# Patient Record
Sex: Female | Born: 1935
Health system: Southern US, Community
[De-identification: ages and names within clinical notes are randomized; demographics above are authoritative.]

## PROBLEM LIST (undated history)

## (undated) DIAGNOSIS — E039 Hypothyroidism, unspecified: Secondary | ICD-10-CM

## (undated) DIAGNOSIS — I10 Essential (primary) hypertension: Secondary | ICD-10-CM

## (undated) DIAGNOSIS — H04123 Dry eye syndrome of bilateral lacrimal glands: Secondary | ICD-10-CM

## (undated) HISTORY — PX: CHOLECYSTECTOMY: SHX55

## (undated) HISTORY — DX: Hypothyroidism, unspecified: E03.9

## (undated) HISTORY — PX: CATARACT EXTRACTION: SUR2

## (undated) HISTORY — DX: Dry eye syndrome of bilateral lacrimal glands: H04.123

## (undated) HISTORY — PX: KNEE ARTHROSCOPY W/ ORIF: SUR98

## (undated) HISTORY — DX: Essential (primary) hypertension: I10

---

## 2000-04-11 ENCOUNTER — Emergency Department (HOSPITAL_COMMUNITY): Admission: EM | Admit: 2000-04-11 | Discharge: 2000-04-12 | Payer: Self-pay | Admitting: Emergency Medicine

## 2000-04-12 ENCOUNTER — Encounter: Payer: Self-pay | Admitting: Emergency Medicine

## 2000-05-07 ENCOUNTER — Other Ambulatory Visit: Admission: RE | Admit: 2000-05-07 | Discharge: 2000-05-07 | Payer: Self-pay | Admitting: *Deleted

## 2000-05-08 ENCOUNTER — Encounter (INDEPENDENT_AMBULATORY_CARE_PROVIDER_SITE_OTHER): Payer: Self-pay | Admitting: Specialist

## 2000-05-08 ENCOUNTER — Other Ambulatory Visit: Admission: RE | Admit: 2000-05-08 | Discharge: 2000-05-08 | Payer: Self-pay | Admitting: *Deleted

## 2000-06-10 ENCOUNTER — Encounter: Payer: Self-pay | Admitting: Obstetrics and Gynecology

## 2000-06-10 ENCOUNTER — Ambulatory Visit (HOSPITAL_COMMUNITY): Admission: RE | Admit: 2000-06-10 | Discharge: 2000-06-10 | Payer: Self-pay | Admitting: Obstetrics and Gynecology

## 2000-12-23 ENCOUNTER — Encounter (INDEPENDENT_AMBULATORY_CARE_PROVIDER_SITE_OTHER): Payer: Self-pay | Admitting: Specialist

## 2000-12-23 ENCOUNTER — Other Ambulatory Visit: Admission: RE | Admit: 2000-12-23 | Discharge: 2000-12-23 | Payer: Self-pay | Admitting: Obstetrics and Gynecology

## 2001-05-12 ENCOUNTER — Other Ambulatory Visit: Admission: RE | Admit: 2001-05-12 | Discharge: 2001-05-12 | Payer: Self-pay | Admitting: Obstetrics and Gynecology

## 2001-06-14 ENCOUNTER — Ambulatory Visit (HOSPITAL_COMMUNITY): Admission: RE | Admit: 2001-06-14 | Discharge: 2001-06-14 | Payer: Self-pay | Admitting: Obstetrics and Gynecology

## 2001-06-14 ENCOUNTER — Encounter: Payer: Self-pay | Admitting: Obstetrics and Gynecology

## 2001-08-12 ENCOUNTER — Encounter: Payer: Self-pay | Admitting: Internal Medicine

## 2001-08-12 ENCOUNTER — Encounter: Admission: RE | Admit: 2001-08-12 | Discharge: 2001-08-12 | Payer: Self-pay | Admitting: Internal Medicine

## 2003-09-13 ENCOUNTER — Emergency Department (HOSPITAL_COMMUNITY): Admission: EM | Admit: 2003-09-13 | Discharge: 2003-09-13 | Payer: Self-pay | Admitting: Emergency Medicine

## 2004-04-13 ENCOUNTER — Inpatient Hospital Stay (HOSPITAL_COMMUNITY): Admission: EM | Admit: 2004-04-13 | Discharge: 2004-04-19 | Payer: Self-pay | Admitting: Emergency Medicine

## 2004-04-19 ENCOUNTER — Ambulatory Visit: Payer: Self-pay | Admitting: Physical Medicine & Rehabilitation

## 2004-04-19 ENCOUNTER — Inpatient Hospital Stay
Admission: RE | Admit: 2004-04-19 | Discharge: 2004-04-30 | Payer: Self-pay | Admitting: Physical Medicine & Rehabilitation

## 2004-07-22 ENCOUNTER — Ambulatory Visit (HOSPITAL_COMMUNITY): Admission: RE | Admit: 2004-07-22 | Discharge: 2004-07-22 | Payer: Self-pay | Admitting: Internal Medicine

## 2004-12-04 ENCOUNTER — Ambulatory Visit: Payer: Self-pay | Admitting: Gastroenterology

## 2004-12-18 ENCOUNTER — Ambulatory Visit: Payer: Self-pay | Admitting: Gastroenterology

## 2004-12-18 ENCOUNTER — Encounter (INDEPENDENT_AMBULATORY_CARE_PROVIDER_SITE_OTHER): Payer: Self-pay | Admitting: *Deleted

## 2005-05-13 ENCOUNTER — Encounter: Admission: RE | Admit: 2005-05-13 | Discharge: 2005-05-13 | Payer: Self-pay | Admitting: Orthopedic Surgery

## 2005-05-14 ENCOUNTER — Ambulatory Visit (HOSPITAL_BASED_OUTPATIENT_CLINIC_OR_DEPARTMENT_OTHER): Admission: RE | Admit: 2005-05-14 | Discharge: 2005-05-14 | Payer: Self-pay | Admitting: Orthopedic Surgery

## 2005-05-14 ENCOUNTER — Ambulatory Visit (HOSPITAL_COMMUNITY): Admission: RE | Admit: 2005-05-14 | Discharge: 2005-05-14 | Payer: Self-pay | Admitting: Orthopedic Surgery

## 2005-07-29 ENCOUNTER — Ambulatory Visit (HOSPITAL_COMMUNITY): Admission: RE | Admit: 2005-07-29 | Discharge: 2005-07-29 | Payer: Self-pay | Admitting: Internal Medicine

## 2005-08-15 ENCOUNTER — Encounter: Admission: RE | Admit: 2005-08-15 | Discharge: 2005-08-15 | Payer: Self-pay | Admitting: Internal Medicine

## 2006-08-17 ENCOUNTER — Encounter: Admission: RE | Admit: 2006-08-17 | Discharge: 2006-08-17 | Payer: Self-pay | Admitting: Internal Medicine

## 2007-08-18 ENCOUNTER — Encounter: Admission: RE | Admit: 2007-08-18 | Discharge: 2007-08-18 | Payer: Self-pay | Admitting: Internal Medicine

## 2007-12-14 ENCOUNTER — Ambulatory Visit: Payer: Self-pay | Admitting: Gastroenterology

## 2007-12-28 ENCOUNTER — Ambulatory Visit: Payer: Self-pay | Admitting: Gastroenterology

## 2008-08-22 ENCOUNTER — Encounter: Admission: RE | Admit: 2008-08-22 | Discharge: 2008-08-22 | Payer: Self-pay | Admitting: Internal Medicine

## 2009-04-26 DIAGNOSIS — M899 Disorder of bone, unspecified: Secondary | ICD-10-CM | POA: Insufficient documentation

## 2009-04-26 DIAGNOSIS — E559 Vitamin D deficiency, unspecified: Secondary | ICD-10-CM | POA: Insufficient documentation

## 2009-04-26 DIAGNOSIS — E785 Hyperlipidemia, unspecified: Secondary | ICD-10-CM | POA: Insufficient documentation

## 2009-04-26 DIAGNOSIS — K5900 Constipation, unspecified: Secondary | ICD-10-CM | POA: Insufficient documentation

## 2009-06-28 DIAGNOSIS — R0600 Dyspnea, unspecified: Secondary | ICD-10-CM | POA: Insufficient documentation

## 2009-08-23 ENCOUNTER — Encounter: Admission: RE | Admit: 2009-08-23 | Discharge: 2009-08-23 | Payer: Self-pay | Admitting: Internal Medicine

## 2009-12-26 ENCOUNTER — Encounter: Payer: Self-pay | Admitting: Gastroenterology

## 2009-12-26 DIAGNOSIS — R634 Abnormal weight loss: Secondary | ICD-10-CM | POA: Insufficient documentation

## 2009-12-26 DIAGNOSIS — H209 Unspecified iridocyclitis: Secondary | ICD-10-CM | POA: Insufficient documentation

## 2009-12-26 DIAGNOSIS — H409 Unspecified glaucoma: Secondary | ICD-10-CM | POA: Insufficient documentation

## 2009-12-28 ENCOUNTER — Ambulatory Visit (HOSPITAL_COMMUNITY): Admission: RE | Admit: 2009-12-28 | Discharge: 2009-12-28 | Payer: Self-pay | Admitting: Internal Medicine

## 2009-12-28 DIAGNOSIS — R7401 Elevation of levels of liver transaminase levels: Secondary | ICD-10-CM | POA: Insufficient documentation

## 2009-12-28 DIAGNOSIS — K8064 Calculus of gallbladder and bile duct with chronic cholecystitis without obstruction: Secondary | ICD-10-CM | POA: Insufficient documentation

## 2010-01-04 ENCOUNTER — Encounter: Admission: RE | Admit: 2010-01-04 | Discharge: 2010-01-04 | Payer: Self-pay | Admitting: Internal Medicine

## 2010-01-09 ENCOUNTER — Encounter: Payer: Self-pay | Admitting: Gastroenterology

## 2010-01-09 DIAGNOSIS — R945 Abnormal results of liver function studies: Secondary | ICD-10-CM | POA: Insufficient documentation

## 2010-02-04 ENCOUNTER — Encounter: Payer: Self-pay | Admitting: Gastroenterology

## 2010-02-05 ENCOUNTER — Encounter: Payer: Self-pay | Admitting: Gastroenterology

## 2010-02-06 ENCOUNTER — Encounter (INDEPENDENT_AMBULATORY_CARE_PROVIDER_SITE_OTHER): Payer: Self-pay | Admitting: General Surgery

## 2010-02-06 ENCOUNTER — Ambulatory Visit (HOSPITAL_COMMUNITY): Admission: RE | Admit: 2010-02-06 | Discharge: 2010-02-07 | Payer: Self-pay | Admitting: General Surgery

## 2010-03-27 DIAGNOSIS — N939 Abnormal uterine and vaginal bleeding, unspecified: Secondary | ICD-10-CM | POA: Insufficient documentation

## 2010-07-11 NOTE — Letter (Signed)
Summary: [facility]  [facility]   Imported By: Lester [place] 02/14/2010 11:12:56  _____________________________________________________________________  External Attachment:    Type:   Image     Comment:   External Document

## 2010-07-18 ENCOUNTER — Other Ambulatory Visit: Payer: Self-pay | Admitting: Internal Medicine

## 2010-07-18 DIAGNOSIS — Z1231 Encounter for screening mammogram for malignant neoplasm of breast: Secondary | ICD-10-CM

## 2010-08-22 LAB — COMPREHENSIVE METABOLIC PANEL
ALT: 90 U/L — ABNORMAL HIGH (ref 0–35)
AST: 152 U/L — ABNORMAL HIGH (ref 0–37)
Albumin: 3.2 g/dL — ABNORMAL LOW (ref 3.5–5.2)
CO2: 25 mEq/L (ref 19–32)
Calcium: 8.7 mg/dL (ref 8.4–10.5)
Creatinine, Ser: 0.94 mg/dL (ref 0.4–1.2)
GFR calc Af Amer: >60 mL/min (ref >60)
GFR calc non Af Amer: 57 mL/min — ABNORMAL LOW (ref >60)
Sodium: 142 mEq/L (ref 135–145)
Total Protein: 5.7 g/dL — ABNORMAL LOW (ref 6.0–8.3)

## 2010-08-22 LAB — DIFFERENTIAL
Basophils Absolute: 0 10*3/uL (ref 0.0–0.1)
Basophils Relative: 1 % (ref 0–1)
Eosinophils Relative: 2 % (ref 0–5)
Monocytes Absolute: 0.6 10*3/uL (ref 0.1–1.0)

## 2010-08-22 LAB — CBC
Hemoglobin: 12.8 g/dL (ref 12.0–15.0)
MCHC: 33.8 g/dL (ref 30.0–36.0)
Platelets: 152 10*3/uL (ref 150–400)

## 2010-08-22 LAB — SURGICAL PCR SCREEN
MRSA, PCR: NEGATIVE
Staphylococcus aureus: NEGATIVE

## 2010-08-26 ENCOUNTER — Ambulatory Visit (HOSPITAL_COMMUNITY)
Admission: RE | Admit: 2010-08-26 | Discharge: 2010-08-26 | Disposition: A | Discharge status: Home / Self Care | Payer: 59 | Source: Ambulatory Visit | Attending: Internal Medicine | Admitting: Internal Medicine

## 2010-08-26 DIAGNOSIS — Z1231 Encounter for screening mammogram for malignant neoplasm of breast: Secondary | ICD-10-CM | POA: Insufficient documentation

## 2010-09-03 DIAGNOSIS — R600 Localized edema: Secondary | ICD-10-CM | POA: Insufficient documentation

## 2010-10-07 IMAGING — RF DG CHOLANGIOGRAM OPERATIVE
1 series · 4 of 4 positions shown · non-contrast
Comparison: None

CLINICAL DATA: Cholelithiasis

INTRAOPERATIVE CHOLANGIOGRAM
TECHNIQUE: Cholangiographic images from the C-arm fluoroscopic
device were submitted for interpretation post-operatively.  Please
see the procedural report for the amount of contrast and the
fluoroscopy time utilized.

[Series 1: run · 4 of 65 frames shown]
[frame 2/65]
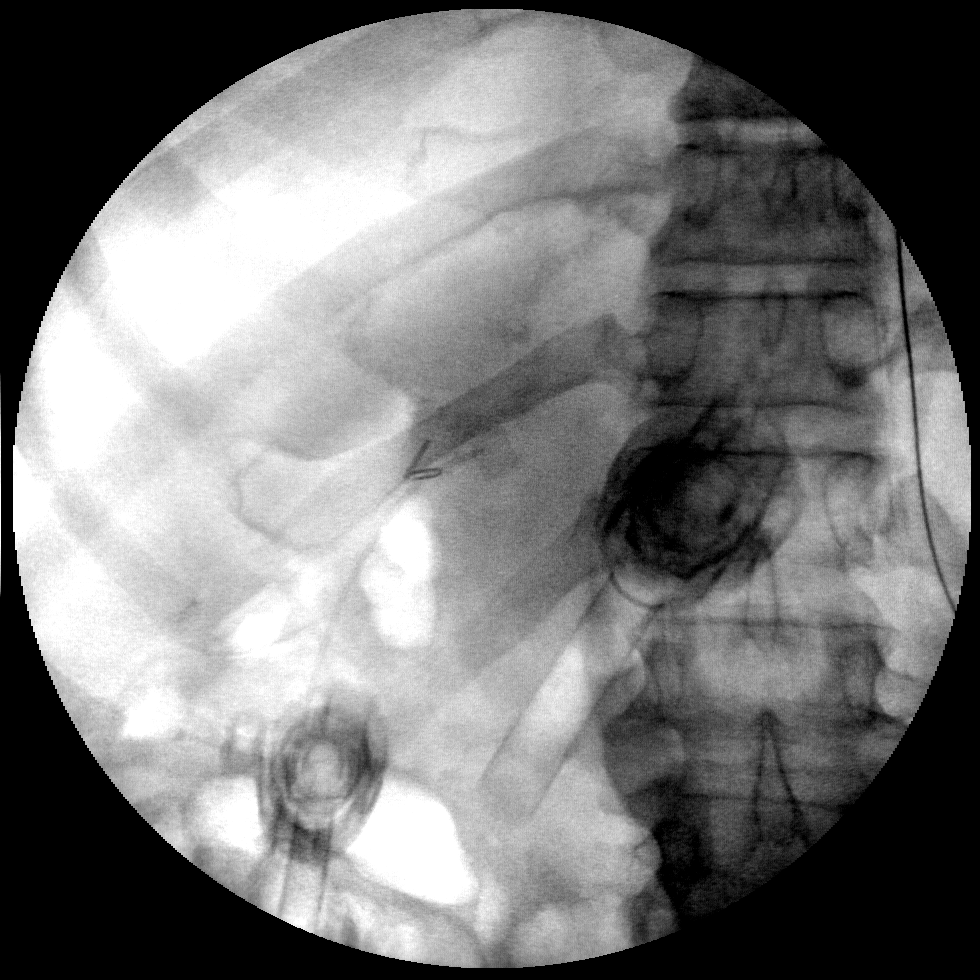
[frame 10/65]
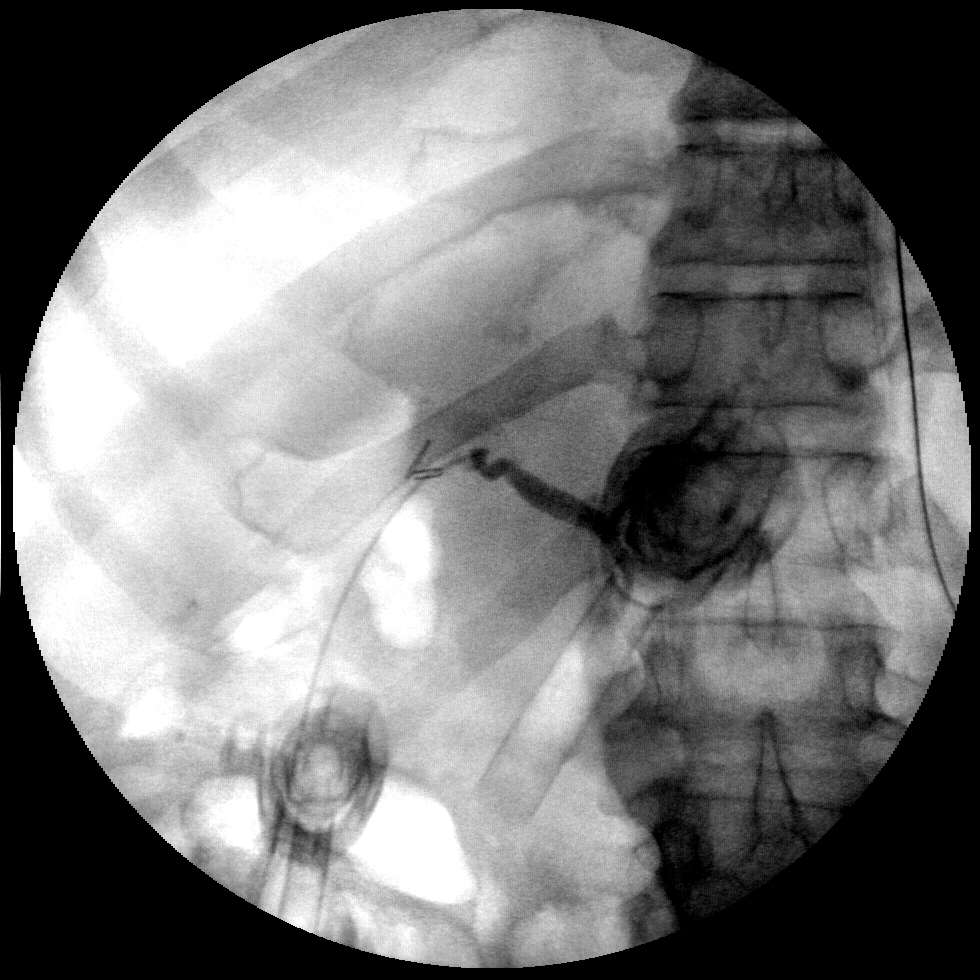
[frame 33/65]
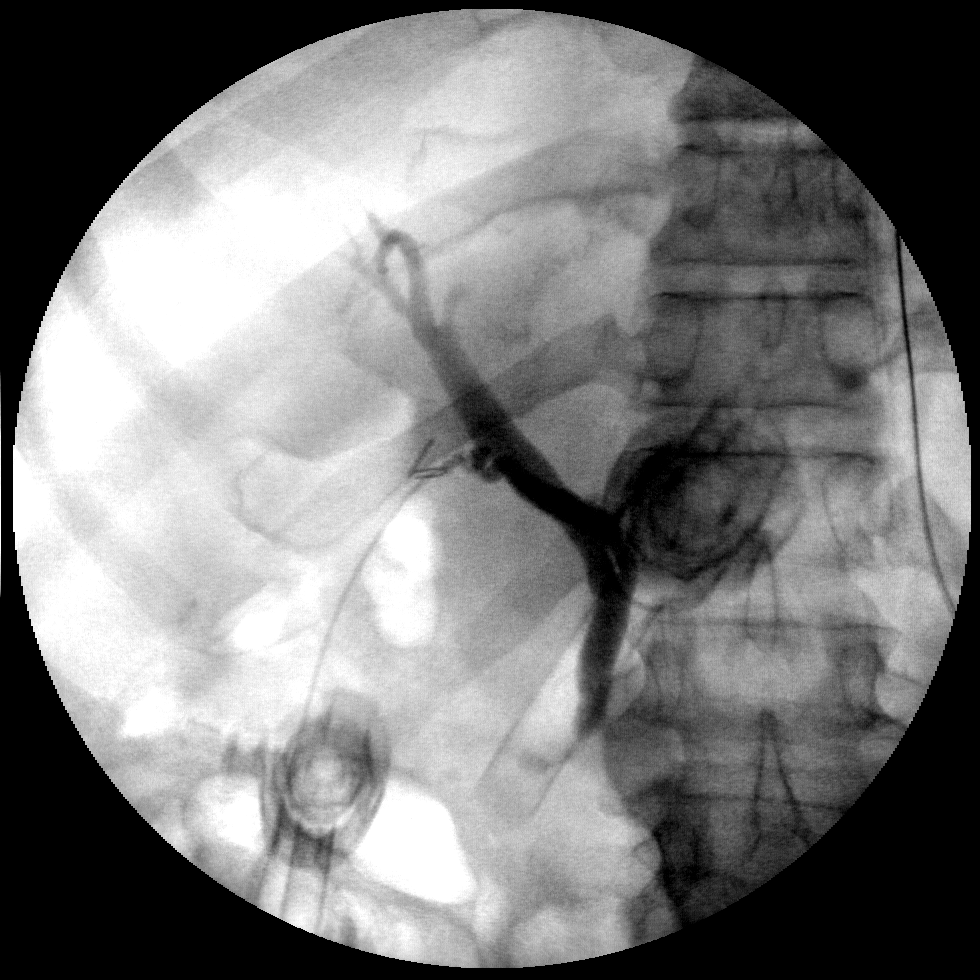
[frame 56/65]
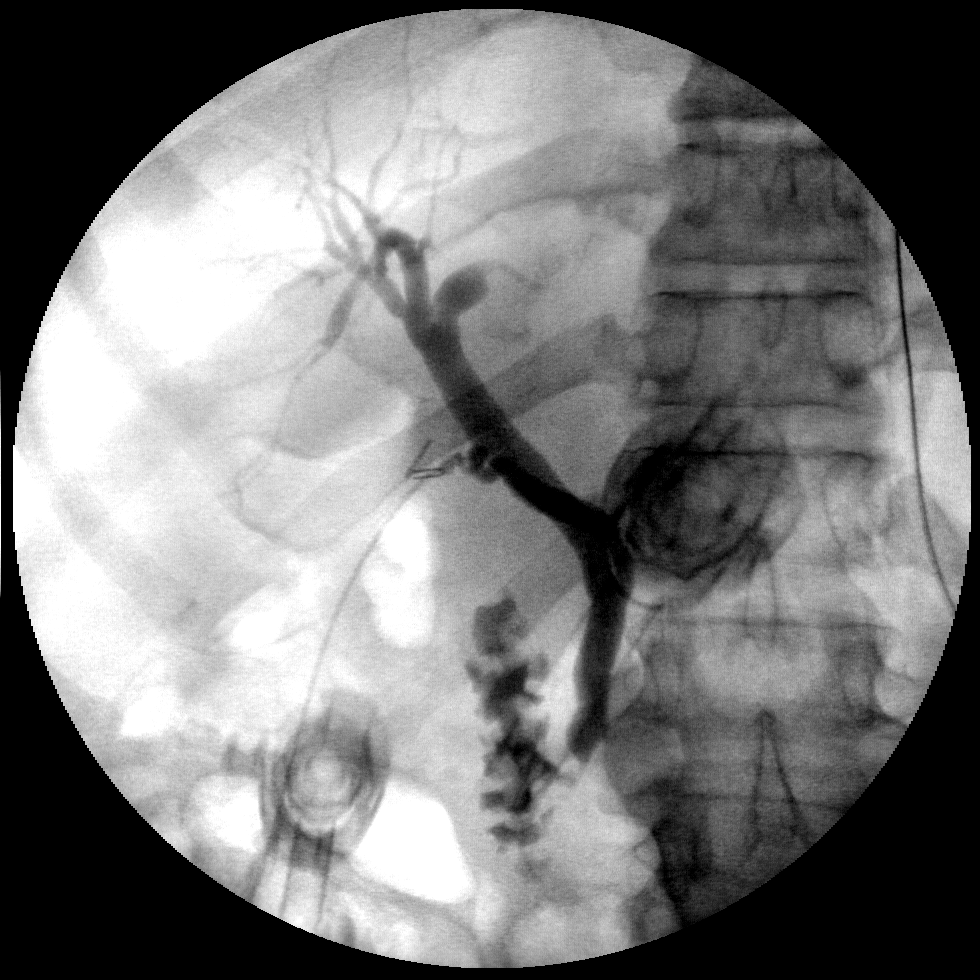

[4 of 4 positions shown; findings below may reference images not displayed]

FINDINGS: No persistent filling defects in the common duct.
Intrahepatic ducts are incompletely visualized, appearing
decompressed centrally. Contrast passes into the duodenum.

IMPRESSION

Negative for retained common duct stone.

## 2010-10-25 NOTE — Op Note (Signed)
NAMEMERIAN, WROE                ACCOUNT NO.:  [id number]   MEDICAL RECORD NO.:  [id number]          PATIENT TYPE:  AMB   LOCATION:  DSC                          FACILITY:  [facility]   PHYSICIAN:  Harvie Junior, M.D.   DATE OF BIRTH:  December 22, 1925   DATE OF PROCEDURE:  05/14/2005  DATE OF DISCHARGE:                                 OPERATIVE REPORT   PREOPERATIVE DIAGNOSIS:  Status post lateral tibial plateau fracture with  lateral knee pain.   POSTOPERATIVE DIAGNOSES:  1.  Status post lateral tibial plateau fracture with lateral knee pain.  2.  Loose body laterally.  3.  Chondromalacia of medial femoral condyle with meniscal tear medially.   PRINCIPAL PROCEDURES:  1.  Debridement of chondromalacia of the lateral tibial plateau by way of      chondroplasty.  2.  Removal of osteocartilaginous loose body laterally.  3.  Debridement of posterior horn medial meniscus with corresponding      debridement of medial femoral condyle.   SURGEON:  Harvie Junior, M.D.   ASSISTANT:  Marshia Ly, P.A.   ANESTHESIA:  General.   BRIEF HISTORY:  She is a 77 year old female with a long history of having  had a tibial plateau fracture.  She did well initially, got her motion back,  things were going well, but began having increasing pain that has persisted  over time.  We talked about treatment options.  Injection therapy had been  performed.  She had done reasonably well over time but continued with pain  and because of that continued pain, I felt that an arthroscopy was  appropriate and she was brought to the operating room for this procedure.   PROCEDURE:  The patient was brought to the operating room and after an  adequate level of anesthesia obtained with a general anesthetic, the patient  was placed supine on the operating table and the left leg was prepped and  draped in the usual sterile fashion.  Following this, routine arthroscopic  examination of the knee revealed that the  patellofemoral joint was well-  aligned.  There was some minimal chondromalacia.  Attention was turned to  the medial femoral condyle, where there were grade 2 changes on the medial  femoral condyle over a fairly large area.  This was debrided with a suction  shaver back to a smooth and stable rim.  Attention was turned to the  posterior horn of medial meniscus, which had a small tear which was debrided  back to a smooth and stable rim.  The ACL was normal.  Attention was turned  laterally, where the lateral meniscus looked in good shape.  The lateral  femoral condyle was essentially normal.  The lateral tibial plateau showed a  sort of a chondral defect on the medial portion, almost in a linear area of  where the old area of fracture was.  As this progressed anteriorly, there  was an osteocartilaginous loose body anteriorly with a corresponding osseous  hole.  At this point this area was debrided thoroughly with a suction  shaver.  A probe  was used to make sure there were no loose or fragmenting  pieces.  The lateral meniscus was probed and noted to be within normal  limits.  Attention was then turned back to the medial side, where this was  also noted to be within normal limits.  No loose or fragmented pieces were  seen.  Attention was turned back up into the patellofemoral joint, which  looked okay.  Back laterally, the lateral meniscus probed at length, lateral  femoral condyle probed at length.  The lateral tibial plateau probed again  to make sure that there were  no other loose or fragmented pieces.  Seeing none, the knee was copiously  irrigated and suctioned dry.  The arthroscopic portals were closed with a  bandage, a sterile and compressive dressing applied, and taken to recovery,  where she was noted to be in satisfactory condition.  Estimated blood loss  for the procedure was none.      Harvie Junior, M.D.  Electronically Signed     JLG/MEDQ  D:  05/14/2005  T:   05/14/2005  Job:  [id number]
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
## 2011-07-21 ENCOUNTER — Other Ambulatory Visit (HOSPITAL_COMMUNITY): Payer: Self-pay | Admitting: Internal Medicine

## 2011-07-21 DIAGNOSIS — Z1231 Encounter for screening mammogram for malignant neoplasm of breast: Secondary | ICD-10-CM

## 2011-08-27 ENCOUNTER — Ambulatory Visit (HOSPITAL_COMMUNITY)
Admission: RE | Admit: 2011-08-27 | Discharge: 2011-08-27 | Disposition: A | Discharge status: Home / Self Care | Payer: 59 | Source: Ambulatory Visit | Attending: Internal Medicine | Admitting: Internal Medicine

## 2011-08-27 DIAGNOSIS — Z1231 Encounter for screening mammogram for malignant neoplasm of breast: Secondary | ICD-10-CM | POA: Insufficient documentation

## 2012-07-22 ENCOUNTER — Other Ambulatory Visit (HOSPITAL_COMMUNITY): Payer: Self-pay | Admitting: Internal Medicine

## 2012-07-22 DIAGNOSIS — Z1231 Encounter for screening mammogram for malignant neoplasm of breast: Secondary | ICD-10-CM

## 2012-08-30 ENCOUNTER — Ambulatory Visit (HOSPITAL_COMMUNITY)
Admission: RE | Admit: 2012-08-30 | Discharge: 2012-08-30 | Disposition: A | Discharge status: Home / Self Care | Payer: Medicare Other | Source: Ambulatory Visit | Attending: Internal Medicine | Admitting: Internal Medicine

## 2012-08-30 DIAGNOSIS — Z1231 Encounter for screening mammogram for malignant neoplasm of breast: Secondary | ICD-10-CM | POA: Insufficient documentation

## 2012-12-29 ENCOUNTER — Encounter: Payer: Self-pay | Admitting: Gastroenterology

## 2013-06-21 DIAGNOSIS — E669 Obesity, unspecified: Secondary | ICD-10-CM | POA: Insufficient documentation

## 2013-08-09 ENCOUNTER — Other Ambulatory Visit (HOSPITAL_COMMUNITY): Payer: Self-pay | Admitting: Internal Medicine

## 2013-08-09 DIAGNOSIS — Z1231 Encounter for screening mammogram for malignant neoplasm of breast: Secondary | ICD-10-CM

## 2013-09-05 ENCOUNTER — Ambulatory Visit (HOSPITAL_COMMUNITY)
Admission: RE | Admit: 2013-09-05 | Discharge: 2013-09-05 | Disposition: A | Discharge status: Home / Self Care | Payer: Medicare Other | Source: Ambulatory Visit | Attending: Internal Medicine | Admitting: Internal Medicine

## 2013-09-05 DIAGNOSIS — Z1231 Encounter for screening mammogram for malignant neoplasm of breast: Secondary | ICD-10-CM | POA: Insufficient documentation

## 2013-09-12 DIAGNOSIS — H9190 Unspecified hearing loss, unspecified ear: Secondary | ICD-10-CM | POA: Insufficient documentation

## 2014-06-19 DIAGNOSIS — Z0000 Encounter for general adult medical examination without abnormal findings: Secondary | ICD-10-CM | POA: Diagnosis not present

## 2014-06-19 DIAGNOSIS — E785 Hyperlipidemia, unspecified: Secondary | ICD-10-CM | POA: Diagnosis not present

## 2014-06-19 DIAGNOSIS — I10 Essential (primary) hypertension: Secondary | ICD-10-CM | POA: Diagnosis not present

## 2014-06-19 DIAGNOSIS — E039 Hypothyroidism, unspecified: Secondary | ICD-10-CM | POA: Diagnosis not present

## 2014-06-19 DIAGNOSIS — E559 Vitamin D deficiency, unspecified: Secondary | ICD-10-CM | POA: Diagnosis not present

## 2014-06-26 DIAGNOSIS — H9190 Unspecified hearing loss, unspecified ear: Secondary | ICD-10-CM | POA: Diagnosis not present

## 2014-06-26 DIAGNOSIS — K8064 Calculus of gallbladder and bile duct with chronic cholecystitis without obstruction: Secondary | ICD-10-CM | POA: Diagnosis not present

## 2014-06-26 DIAGNOSIS — E785 Hyperlipidemia, unspecified: Secondary | ICD-10-CM | POA: Diagnosis not present

## 2014-06-26 DIAGNOSIS — R945 Abnormal results of liver function studies: Secondary | ICD-10-CM | POA: Diagnosis not present

## 2014-06-26 DIAGNOSIS — R600 Localized edema: Secondary | ICD-10-CM | POA: Diagnosis not present

## 2014-06-26 DIAGNOSIS — H209 Unspecified iridocyclitis: Secondary | ICD-10-CM | POA: Diagnosis not present

## 2014-06-26 DIAGNOSIS — K868 Other specified diseases of pancreas: Secondary | ICD-10-CM | POA: Diagnosis not present

## 2014-06-26 DIAGNOSIS — K5900 Constipation, unspecified: Secondary | ICD-10-CM | POA: Diagnosis not present

## 2014-06-28 DIAGNOSIS — H349 Unspecified retinal vascular occlusion: Secondary | ICD-10-CM | POA: Diagnosis not present

## 2014-06-28 DIAGNOSIS — H2513 Age-related nuclear cataract, bilateral: Secondary | ICD-10-CM | POA: Diagnosis not present

## 2014-07-04 DIAGNOSIS — H35351 Cystoid macular degeneration, right eye: Secondary | ICD-10-CM | POA: Diagnosis not present

## 2014-07-04 DIAGNOSIS — H34831 Tributary (branch) retinal vein occlusion, right eye: Secondary | ICD-10-CM | POA: Diagnosis not present

## 2014-08-01 ENCOUNTER — Other Ambulatory Visit (HOSPITAL_COMMUNITY): Payer: Self-pay | Admitting: Internal Medicine

## 2014-08-01 DIAGNOSIS — Z1231 Encounter for screening mammogram for malignant neoplasm of breast: Secondary | ICD-10-CM

## 2014-08-22 DIAGNOSIS — H34831 Tributary (branch) retinal vein occlusion, right eye: Secondary | ICD-10-CM | POA: Diagnosis not present

## 2014-08-22 DIAGNOSIS — H35351 Cystoid macular degeneration, right eye: Secondary | ICD-10-CM | POA: Diagnosis not present

## 2014-09-07 ENCOUNTER — Ambulatory Visit (HOSPITAL_COMMUNITY)
Admission: RE | Admit: 2014-09-07 | Discharge: 2014-09-07 | Disposition: A | Discharge status: Home / Self Care | Payer: Commercial Managed Care - HMO | Source: Ambulatory Visit | Attending: Internal Medicine | Admitting: Internal Medicine

## 2014-09-07 DIAGNOSIS — Z1231 Encounter for screening mammogram for malignant neoplasm of breast: Secondary | ICD-10-CM

## 2014-09-28 DIAGNOSIS — H34831 Tributary (branch) retinal vein occlusion, right eye: Secondary | ICD-10-CM | POA: Diagnosis not present

## 2014-11-09 DIAGNOSIS — H34831 Tributary (branch) retinal vein occlusion, right eye: Secondary | ICD-10-CM | POA: Diagnosis not present

## 2014-12-21 DIAGNOSIS — H34811 Central retinal vein occlusion, right eye: Secondary | ICD-10-CM | POA: Diagnosis not present

## 2015-01-01 DIAGNOSIS — H34819 Central retinal vein occlusion, unspecified eye: Secondary | ICD-10-CM | POA: Diagnosis not present

## 2015-01-01 DIAGNOSIS — E039 Hypothyroidism, unspecified: Secondary | ICD-10-CM | POA: Diagnosis not present

## 2015-01-01 DIAGNOSIS — R945 Abnormal results of liver function studies: Secondary | ICD-10-CM | POA: Diagnosis not present

## 2015-01-01 DIAGNOSIS — M6530 Trigger finger, unspecified finger: Secondary | ICD-10-CM | POA: Insufficient documentation

## 2015-01-01 DIAGNOSIS — E669 Obesity, unspecified: Secondary | ICD-10-CM | POA: Diagnosis not present

## 2015-01-01 DIAGNOSIS — I10 Essential (primary) hypertension: Secondary | ICD-10-CM | POA: Diagnosis not present

## 2015-01-01 DIAGNOSIS — M65332 Trigger finger, left middle finger: Secondary | ICD-10-CM | POA: Diagnosis not present

## 2015-01-01 DIAGNOSIS — Z6833 Body mass index (BMI) 33.0-33.9, adult: Secondary | ICD-10-CM | POA: Diagnosis not present

## 2015-01-18 DIAGNOSIS — H34831 Tributary (branch) retinal vein occlusion, right eye: Secondary | ICD-10-CM | POA: Diagnosis not present

## 2015-01-26 ENCOUNTER — Encounter: Payer: Self-pay | Admitting: Gastroenterology

## 2015-02-13 DIAGNOSIS — M65332 Trigger finger, left middle finger: Secondary | ICD-10-CM | POA: Diagnosis not present

## 2015-02-15 DIAGNOSIS — H34831 Tributary (branch) retinal vein occlusion, right eye: Secondary | ICD-10-CM | POA: Diagnosis not present

## 2015-04-03 DIAGNOSIS — H34831 Tributary (branch) retinal vein occlusion, right eye, with macular edema: Secondary | ICD-10-CM | POA: Diagnosis not present

## 2015-05-22 DIAGNOSIS — H348312 Tributary (branch) retinal vein occlusion, right eye, stable: Secondary | ICD-10-CM | POA: Diagnosis not present

## 2015-05-22 DIAGNOSIS — H35371 Puckering of macula, right eye: Secondary | ICD-10-CM | POA: Diagnosis not present

## 2015-05-23 DIAGNOSIS — H269 Unspecified cataract: Secondary | ICD-10-CM | POA: Insufficient documentation

## 2015-05-31 DIAGNOSIS — Z6833 Body mass index (BMI) 33.0-33.9, adult: Secondary | ICD-10-CM | POA: Diagnosis not present

## 2015-05-31 DIAGNOSIS — E669 Obesity, unspecified: Secondary | ICD-10-CM | POA: Diagnosis not present

## 2015-05-31 DIAGNOSIS — Z0000 Encounter for general adult medical examination without abnormal findings: Secondary | ICD-10-CM | POA: Diagnosis not present

## 2015-05-31 DIAGNOSIS — E039 Hypothyroidism, unspecified: Secondary | ICD-10-CM | POA: Diagnosis not present

## 2015-05-31 DIAGNOSIS — I10 Essential (primary) hypertension: Secondary | ICD-10-CM | POA: Diagnosis not present

## 2015-06-18 DIAGNOSIS — H349 Unspecified retinal vascular occlusion: Secondary | ICD-10-CM | POA: Diagnosis not present

## 2015-06-18 DIAGNOSIS — H2513 Age-related nuclear cataract, bilateral: Secondary | ICD-10-CM | POA: Diagnosis not present

## 2015-06-25 DIAGNOSIS — E038 Other specified hypothyroidism: Secondary | ICD-10-CM | POA: Diagnosis not present

## 2015-06-25 DIAGNOSIS — N390 Urinary tract infection, site not specified: Secondary | ICD-10-CM | POA: Diagnosis not present

## 2015-06-25 DIAGNOSIS — E559 Vitamin D deficiency, unspecified: Secondary | ICD-10-CM | POA: Diagnosis not present

## 2015-06-25 DIAGNOSIS — I10 Essential (primary) hypertension: Secondary | ICD-10-CM | POA: Diagnosis not present

## 2015-06-25 DIAGNOSIS — E784 Other hyperlipidemia: Secondary | ICD-10-CM | POA: Diagnosis not present

## 2015-07-02 DIAGNOSIS — H268 Other specified cataract: Secondary | ICD-10-CM | POA: Diagnosis not present

## 2015-07-02 DIAGNOSIS — M79671 Pain in right foot: Secondary | ICD-10-CM | POA: Diagnosis not present

## 2015-07-02 DIAGNOSIS — K8689 Other specified diseases of pancreas: Secondary | ICD-10-CM | POA: Diagnosis not present

## 2015-07-02 DIAGNOSIS — R600 Localized edema: Secondary | ICD-10-CM | POA: Diagnosis not present

## 2015-07-02 DIAGNOSIS — H9193 Unspecified hearing loss, bilateral: Secondary | ICD-10-CM | POA: Diagnosis not present

## 2015-07-02 DIAGNOSIS — M79672 Pain in left foot: Secondary | ICD-10-CM | POA: Diagnosis not present

## 2015-07-02 DIAGNOSIS — H348192 Central retinal vein occlusion, unspecified eye, stable: Secondary | ICD-10-CM | POA: Diagnosis not present

## 2015-07-02 DIAGNOSIS — M79609 Pain in unspecified limb: Secondary | ICD-10-CM | POA: Insufficient documentation

## 2015-07-02 DIAGNOSIS — E668 Other obesity: Secondary | ICD-10-CM | POA: Diagnosis not present

## 2015-07-02 DIAGNOSIS — M65332 Trigger finger, left middle finger: Secondary | ICD-10-CM | POA: Diagnosis not present

## 2015-07-17 DIAGNOSIS — H34831 Tributary (branch) retinal vein occlusion, right eye, with macular edema: Secondary | ICD-10-CM | POA: Diagnosis not present

## 2015-07-25 DIAGNOSIS — H2512 Age-related nuclear cataract, left eye: Secondary | ICD-10-CM | POA: Diagnosis not present

## 2015-07-25 DIAGNOSIS — H25812 Combined forms of age-related cataract, left eye: Secondary | ICD-10-CM | POA: Diagnosis not present

## 2015-08-30 DIAGNOSIS — H43812 Vitreous degeneration, left eye: Secondary | ICD-10-CM | POA: Diagnosis not present

## 2015-09-04 DIAGNOSIS — H34831 Tributary (branch) retinal vein occlusion, right eye, with macular edema: Secondary | ICD-10-CM | POA: Diagnosis not present

## 2015-09-27 DIAGNOSIS — H34831 Tributary (branch) retinal vein occlusion, right eye, with macular edema: Secondary | ICD-10-CM | POA: Diagnosis not present

## 2015-10-04 DIAGNOSIS — Z961 Presence of intraocular lens: Secondary | ICD-10-CM | POA: Diagnosis not present

## 2015-10-23 DIAGNOSIS — H34831 Tributary (branch) retinal vein occlusion, right eye, with macular edema: Secondary | ICD-10-CM | POA: Diagnosis not present

## 2016-01-01 DIAGNOSIS — R945 Abnormal results of liver function studies: Secondary | ICD-10-CM | POA: Diagnosis not present

## 2016-01-01 DIAGNOSIS — M65332 Trigger finger, left middle finger: Secondary | ICD-10-CM | POA: Diagnosis not present

## 2016-01-01 DIAGNOSIS — H348192 Central retinal vein occlusion, unspecified eye, stable: Secondary | ICD-10-CM | POA: Diagnosis not present

## 2016-01-01 DIAGNOSIS — Z6834 Body mass index (BMI) 34.0-34.9, adult: Secondary | ICD-10-CM | POA: Diagnosis not present

## 2016-01-01 DIAGNOSIS — I10 Essential (primary) hypertension: Secondary | ICD-10-CM | POA: Diagnosis not present

## 2016-01-01 DIAGNOSIS — E668 Other obesity: Secondary | ICD-10-CM | POA: Diagnosis not present

## 2016-01-01 DIAGNOSIS — M79676 Pain in unspecified toe(s): Secondary | ICD-10-CM | POA: Diagnosis not present

## 2016-01-01 DIAGNOSIS — E038 Other specified hypothyroidism: Secondary | ICD-10-CM | POA: Diagnosis not present

## 2016-01-11 DIAGNOSIS — M65332 Trigger finger, left middle finger: Secondary | ICD-10-CM | POA: Diagnosis not present

## 2016-03-07 DIAGNOSIS — Z23 Encounter for immunization: Secondary | ICD-10-CM | POA: Diagnosis not present

## 2016-03-31 DIAGNOSIS — Z961 Presence of intraocular lens: Secondary | ICD-10-CM | POA: Diagnosis not present

## 2016-04-29 DIAGNOSIS — H34831 Tributary (branch) retinal vein occlusion, right eye, with macular edema: Secondary | ICD-10-CM | POA: Diagnosis not present

## 2016-06-24 DIAGNOSIS — H34831 Tributary (branch) retinal vein occlusion, right eye, with macular edema: Secondary | ICD-10-CM | POA: Diagnosis not present

## 2016-06-27 DIAGNOSIS — I10 Essential (primary) hypertension: Secondary | ICD-10-CM | POA: Diagnosis not present

## 2016-06-27 DIAGNOSIS — E784 Other hyperlipidemia: Secondary | ICD-10-CM | POA: Diagnosis not present

## 2016-06-27 DIAGNOSIS — E038 Other specified hypothyroidism: Secondary | ICD-10-CM | POA: Diagnosis not present

## 2016-06-27 DIAGNOSIS — E559 Vitamin D deficiency, unspecified: Secondary | ICD-10-CM | POA: Diagnosis not present

## 2016-07-03 DIAGNOSIS — Z0000 Encounter for general adult medical examination without abnormal findings: Secondary | ICD-10-CM | POA: Diagnosis not present

## 2016-07-03 DIAGNOSIS — Z6834 Body mass index (BMI) 34.0-34.9, adult: Secondary | ICD-10-CM | POA: Diagnosis not present

## 2016-07-03 DIAGNOSIS — K8689 Other specified diseases of pancreas: Secondary | ICD-10-CM | POA: Diagnosis not present

## 2016-07-03 DIAGNOSIS — H349 Unspecified retinal vascular occlusion: Secondary | ICD-10-CM | POA: Diagnosis not present

## 2016-07-03 DIAGNOSIS — E784 Other hyperlipidemia: Secondary | ICD-10-CM | POA: Diagnosis not present

## 2016-07-03 DIAGNOSIS — M79676 Pain in unspecified toe(s): Secondary | ICD-10-CM | POA: Diagnosis not present

## 2016-07-03 DIAGNOSIS — R945 Abnormal results of liver function studies: Secondary | ICD-10-CM | POA: Diagnosis not present

## 2016-07-03 DIAGNOSIS — R740 Nonspecific elevation of levels of transaminase and lactic acid dehydrogenase [LDH]: Secondary | ICD-10-CM | POA: Diagnosis not present

## 2016-07-03 DIAGNOSIS — E668 Other obesity: Secondary | ICD-10-CM | POA: Diagnosis not present

## 2016-08-19 DIAGNOSIS — H34831 Tributary (branch) retinal vein occlusion, right eye, with macular edema: Secondary | ICD-10-CM | POA: Diagnosis not present

## 2016-09-09 DIAGNOSIS — H04123 Dry eye syndrome of bilateral lacrimal glands: Secondary | ICD-10-CM | POA: Diagnosis not present

## 2016-09-09 DIAGNOSIS — H2511 Age-related nuclear cataract, right eye: Secondary | ICD-10-CM | POA: Diagnosis not present

## 2016-10-14 DIAGNOSIS — H34831 Tributary (branch) retinal vein occlusion, right eye, with macular edema: Secondary | ICD-10-CM | POA: Diagnosis not present

## 2016-11-11 DIAGNOSIS — H34831 Tributary (branch) retinal vein occlusion, right eye, with macular edema: Secondary | ICD-10-CM | POA: Diagnosis not present

## 2016-11-11 DIAGNOSIS — H2513 Age-related nuclear cataract, bilateral: Secondary | ICD-10-CM | POA: Diagnosis not present

## 2016-12-09 DIAGNOSIS — H35373 Puckering of macula, bilateral: Secondary | ICD-10-CM | POA: Diagnosis not present

## 2016-12-09 DIAGNOSIS — H34831 Tributary (branch) retinal vein occlusion, right eye, with macular edema: Secondary | ICD-10-CM | POA: Diagnosis not present

## 2017-01-08 DIAGNOSIS — E668 Other obesity: Secondary | ICD-10-CM | POA: Diagnosis not present

## 2017-01-08 DIAGNOSIS — Z6833 Body mass index (BMI) 33.0-33.9, adult: Secondary | ICD-10-CM | POA: Diagnosis not present

## 2017-01-08 DIAGNOSIS — I10 Essential (primary) hypertension: Secondary | ICD-10-CM | POA: Diagnosis not present

## 2017-01-08 DIAGNOSIS — R945 Abnormal results of liver function studies: Secondary | ICD-10-CM | POA: Diagnosis not present

## 2017-01-08 DIAGNOSIS — E038 Other specified hypothyroidism: Secondary | ICD-10-CM | POA: Diagnosis not present

## 2017-01-08 DIAGNOSIS — K8681 Exocrine pancreatic insufficiency: Secondary | ICD-10-CM | POA: Insufficient documentation

## 2017-01-08 DIAGNOSIS — H209 Unspecified iridocyclitis: Secondary | ICD-10-CM | POA: Diagnosis not present

## 2017-02-03 DIAGNOSIS — H43813 Vitreous degeneration, bilateral: Secondary | ICD-10-CM | POA: Diagnosis not present

## 2017-02-03 DIAGNOSIS — H35373 Puckering of macula, bilateral: Secondary | ICD-10-CM | POA: Diagnosis not present

## 2017-02-03 DIAGNOSIS — H34831 Tributary (branch) retinal vein occlusion, right eye, with macular edema: Secondary | ICD-10-CM | POA: Diagnosis not present

## 2017-02-12 DIAGNOSIS — M25562 Pain in left knee: Secondary | ICD-10-CM | POA: Diagnosis not present

## 2017-02-12 DIAGNOSIS — M1712 Unilateral primary osteoarthritis, left knee: Secondary | ICD-10-CM | POA: Diagnosis not present

## 2017-03-06 DIAGNOSIS — Z23 Encounter for immunization: Secondary | ICD-10-CM | POA: Diagnosis not present

## 2017-06-30 DIAGNOSIS — M859 Disorder of bone density and structure, unspecified: Secondary | ICD-10-CM | POA: Diagnosis not present

## 2017-06-30 DIAGNOSIS — R82998 Other abnormal findings in urine: Secondary | ICD-10-CM | POA: Diagnosis not present

## 2017-06-30 DIAGNOSIS — E038 Other specified hypothyroidism: Secondary | ICD-10-CM | POA: Diagnosis not present

## 2017-06-30 DIAGNOSIS — I10 Essential (primary) hypertension: Secondary | ICD-10-CM | POA: Diagnosis not present

## 2017-06-30 DIAGNOSIS — E7849 Other hyperlipidemia: Secondary | ICD-10-CM | POA: Diagnosis not present

## 2017-07-07 DIAGNOSIS — R945 Abnormal results of liver function studies: Secondary | ICD-10-CM | POA: Diagnosis not present

## 2017-07-07 DIAGNOSIS — K8681 Exocrine pancreatic insufficiency: Secondary | ICD-10-CM | POA: Diagnosis not present

## 2017-07-07 DIAGNOSIS — M65332 Trigger finger, left middle finger: Secondary | ICD-10-CM | POA: Diagnosis not present

## 2017-07-07 DIAGNOSIS — R740 Nonspecific elevation of levels of transaminase and lactic acid dehydrogenase [LDH]: Secondary | ICD-10-CM | POA: Diagnosis not present

## 2017-07-07 DIAGNOSIS — H6123 Impacted cerumen, bilateral: Secondary | ICD-10-CM | POA: Diagnosis not present

## 2017-07-07 DIAGNOSIS — H4089 Other specified glaucoma: Secondary | ICD-10-CM | POA: Diagnosis not present

## 2017-07-07 DIAGNOSIS — E668 Other obesity: Secondary | ICD-10-CM | POA: Diagnosis not present

## 2017-07-07 DIAGNOSIS — H6120 Impacted cerumen, unspecified ear: Secondary | ICD-10-CM | POA: Insufficient documentation

## 2017-07-07 DIAGNOSIS — H209 Unspecified iridocyclitis: Secondary | ICD-10-CM | POA: Diagnosis not present

## 2017-07-07 DIAGNOSIS — Z0000 Encounter for general adult medical examination without abnormal findings: Secondary | ICD-10-CM | POA: Diagnosis not present

## 2017-07-14 DIAGNOSIS — Z1212 Encounter for screening for malignant neoplasm of rectum: Secondary | ICD-10-CM | POA: Diagnosis not present

## 2017-12-22 DIAGNOSIS — H349 Unspecified retinal vascular occlusion: Secondary | ICD-10-CM | POA: Diagnosis not present

## 2017-12-22 DIAGNOSIS — H2513 Age-related nuclear cataract, bilateral: Secondary | ICD-10-CM | POA: Diagnosis not present

## 2017-12-23 DIAGNOSIS — H43813 Vitreous degeneration, bilateral: Secondary | ICD-10-CM | POA: Diagnosis not present

## 2017-12-23 DIAGNOSIS — H348312 Tributary (branch) retinal vein occlusion, right eye, stable: Secondary | ICD-10-CM | POA: Diagnosis not present

## 2017-12-23 DIAGNOSIS — H35373 Puckering of macula, bilateral: Secondary | ICD-10-CM | POA: Diagnosis not present

## 2018-01-12 DIAGNOSIS — E038 Other specified hypothyroidism: Secondary | ICD-10-CM | POA: Diagnosis not present

## 2018-01-12 DIAGNOSIS — R042 Hemoptysis: Secondary | ICD-10-CM | POA: Diagnosis not present

## 2018-01-12 DIAGNOSIS — K8681 Exocrine pancreatic insufficiency: Secondary | ICD-10-CM | POA: Diagnosis not present

## 2018-01-12 DIAGNOSIS — M65332 Trigger finger, left middle finger: Secondary | ICD-10-CM | POA: Diagnosis not present

## 2018-01-12 DIAGNOSIS — R945 Abnormal results of liver function studies: Secondary | ICD-10-CM | POA: Diagnosis not present

## 2018-01-12 DIAGNOSIS — E668 Other obesity: Secondary | ICD-10-CM | POA: Diagnosis not present

## 2018-01-12 DIAGNOSIS — H209 Unspecified iridocyclitis: Secondary | ICD-10-CM | POA: Diagnosis not present

## 2018-01-12 DIAGNOSIS — I10 Essential (primary) hypertension: Secondary | ICD-10-CM | POA: Diagnosis not present

## 2018-01-12 DIAGNOSIS — H4089 Other specified glaucoma: Secondary | ICD-10-CM | POA: Diagnosis not present

## 2018-05-05 DIAGNOSIS — H348312 Tributary (branch) retinal vein occlusion, right eye, stable: Secondary | ICD-10-CM | POA: Diagnosis not present

## 2018-05-05 DIAGNOSIS — H35373 Puckering of macula, bilateral: Secondary | ICD-10-CM | POA: Diagnosis not present

## 2018-05-05 DIAGNOSIS — H43813 Vitreous degeneration, bilateral: Secondary | ICD-10-CM | POA: Diagnosis not present

## 2018-06-25 DIAGNOSIS — H524 Presbyopia: Secondary | ICD-10-CM | POA: Diagnosis not present

## 2018-06-25 DIAGNOSIS — H2511 Age-related nuclear cataract, right eye: Secondary | ICD-10-CM | POA: Diagnosis not present

## 2018-06-25 DIAGNOSIS — H35341 Macular cyst, hole, or pseudohole, right eye: Secondary | ICD-10-CM | POA: Diagnosis not present

## 2018-07-05 DIAGNOSIS — I10 Essential (primary) hypertension: Secondary | ICD-10-CM | POA: Diagnosis not present

## 2018-07-05 DIAGNOSIS — E038 Other specified hypothyroidism: Secondary | ICD-10-CM | POA: Diagnosis not present

## 2018-07-05 DIAGNOSIS — R82998 Other abnormal findings in urine: Secondary | ICD-10-CM | POA: Diagnosis not present

## 2018-07-05 DIAGNOSIS — M859 Disorder of bone density and structure, unspecified: Secondary | ICD-10-CM | POA: Diagnosis not present

## 2018-07-05 DIAGNOSIS — E7849 Other hyperlipidemia: Secondary | ICD-10-CM | POA: Diagnosis not present

## 2018-07-12 DIAGNOSIS — M859 Disorder of bone density and structure, unspecified: Secondary | ICD-10-CM | POA: Diagnosis not present

## 2018-07-12 DIAGNOSIS — R221 Localized swelling, mass and lump, neck: Secondary | ICD-10-CM | POA: Insufficient documentation

## 2018-07-12 DIAGNOSIS — H6123 Impacted cerumen, bilateral: Secondary | ICD-10-CM | POA: Diagnosis not present

## 2018-07-12 DIAGNOSIS — G4762 Sleep related leg cramps: Secondary | ICD-10-CM | POA: Diagnosis not present

## 2018-07-12 DIAGNOSIS — Z0000 Encounter for general adult medical examination without abnormal findings: Secondary | ICD-10-CM | POA: Diagnosis not present

## 2018-07-12 DIAGNOSIS — Z1389 Encounter for screening for other disorder: Secondary | ICD-10-CM | POA: Insufficient documentation

## 2018-07-12 DIAGNOSIS — H209 Unspecified iridocyclitis: Secondary | ICD-10-CM | POA: Diagnosis not present

## 2018-07-12 DIAGNOSIS — Z1339 Encounter for screening examination for other mental health and behavioral disorders: Secondary | ICD-10-CM | POA: Diagnosis not present

## 2018-07-12 DIAGNOSIS — R252 Cramp and spasm: Secondary | ICD-10-CM | POA: Insufficient documentation

## 2018-07-12 DIAGNOSIS — Z1331 Encounter for screening for depression: Secondary | ICD-10-CM | POA: Diagnosis not present

## 2018-07-12 DIAGNOSIS — Z23 Encounter for immunization: Secondary | ICD-10-CM | POA: Diagnosis not present

## 2018-07-12 DIAGNOSIS — H409 Unspecified glaucoma: Secondary | ICD-10-CM | POA: Diagnosis not present

## 2018-07-12 DIAGNOSIS — E668 Other obesity: Secondary | ICD-10-CM | POA: Diagnosis not present

## 2018-07-12 DIAGNOSIS — K8681 Exocrine pancreatic insufficiency: Secondary | ICD-10-CM | POA: Diagnosis not present

## 2018-07-13 ENCOUNTER — Other Ambulatory Visit: Payer: Self-pay | Admitting: Internal Medicine

## 2018-07-13 DIAGNOSIS — R221 Localized swelling, mass and lump, neck: Secondary | ICD-10-CM

## 2018-07-16 ENCOUNTER — Ambulatory Visit: Payer: Medicare HMO | Admitting: Podiatry

## 2018-07-19 ENCOUNTER — Other Ambulatory Visit: Payer: Self-pay

## 2018-07-19 ENCOUNTER — Encounter: Payer: Self-pay | Admitting: Podiatry

## 2018-07-19 ENCOUNTER — Ambulatory Visit (INDEPENDENT_AMBULATORY_CARE_PROVIDER_SITE_OTHER): Payer: Medicare HMO | Admitting: Podiatry

## 2018-07-19 VITALS — BP 149/66 | HR 68

## 2018-07-19 DIAGNOSIS — M79674 Pain in right toe(s): Secondary | ICD-10-CM | POA: Diagnosis not present

## 2018-07-19 DIAGNOSIS — M79675 Pain in left toe(s): Secondary | ICD-10-CM | POA: Diagnosis not present

## 2018-07-19 DIAGNOSIS — I739 Peripheral vascular disease, unspecified: Secondary | ICD-10-CM

## 2018-07-19 DIAGNOSIS — L84 Corns and callosities: Secondary | ICD-10-CM

## 2018-07-19 DIAGNOSIS — B351 Tinea unguium: Secondary | ICD-10-CM

## 2018-07-19 DIAGNOSIS — E039 Hypothyroidism, unspecified: Secondary | ICD-10-CM | POA: Insufficient documentation

## 2018-07-19 DIAGNOSIS — H04129 Dry eye syndrome of unspecified lacrimal gland: Secondary | ICD-10-CM | POA: Insufficient documentation

## 2018-07-19 DIAGNOSIS — I10 Essential (primary) hypertension: Secondary | ICD-10-CM | POA: Insufficient documentation

## 2018-07-19 NOTE — Patient Instructions (Signed)
Onychomycosis/Fungal Toenails  WHAT IS IT? An infection that lies within the keratin of your nail plate that is caused by a fungus.  WHY ME? Fungal infections affect all ages, sexes, races, and creeds.  There may be many factors that predispose you to a fungal infection such as age, coexisting medical conditions such as diabetes, or an autoimmune disease; stress, medications, fatigue, genetics, etc.  Bottom line: fungus thrives in a warm, moist environment and your shoes offer such a location.  IS IT CONTAGIOUS? Theoretically, yes.  You do not want to share shoes, nail clippers or files with someone who has fungal toenails.  Walking around barefoot in the same room or sleeping in the same bed is unlikely to transfer the organism.  It is important to realize, however, that fungus can spread easily from one nail to the next on the same foot.  HOW DO WE TREAT THIS?  There are several ways to treat this condition.  Treatment may depend on many factors such as age, medications, pregnancy, liver and kidney conditions, etc.  It is best to ask your doctor which options are available to you.  1. No treatment.   Unlike many other medical concerns, you can live with this condition.  However for many people this can be a painful condition and may lead to ingrown toenails or a bacterial infection.  It is recommended that you keep the nails cut short to help reduce the amount of fungal nail. 2. Topical treatment.  These range from herbal remedies to prescription strength nail lacquers.  About 40-50% effective, topicals require twice daily application for approximately 9 to 12 months or until an entirely new nail has grown out.  The most effective topicals are medical grade medications available through physicians offices. 3. Oral antifungal medications.  With an 80-90% cure rate, the most common oral medication requires 3 to 4 months of therapy and stays in your system for a year as the new nail grows out.  Oral  antifungal medications do require blood work to make sure it is a safe drug for you.  A liver function panel will be performed prior to starting the medication and after the first month of treatment.  It is important to have the blood work performed to avoid any harmful side effects.  In general, this medication safe but blood work is required. 4. Laser Therapy.  This treatment is performed by applying a specialized laser to the affected nail plate.  This therapy is noninvasive, fast, and non-painful.  It is not covered by insurance and is therefore, out of pocket.  The results have been very good with a 80-95% cure rate.  [facility] is the only practice in the area to offer this therapy. Permanent Nail Avulsion.  Removing the entire nail so that a new nail will not grow back.Corns and Calluses Corns are small areas of thickened skin that occur on the top, sides, or tip of a toe. They contain a cone-shaped core with a point that can press on a nerve below. This causes pain.  Calluses are areas of thickened skin that can occur anywhere on the body, including the hands, fingers, palms, soles of the feet, and heels. Calluses are usually larger than corns. What are the causes? Corns and calluses are caused by rubbing (friction) or pressure, such as from shoes that are too tight or do not fit properly. What increases the risk? Corns are more likely to develop in people who have misshapen   toes (toe deformities), such as hammer toes. Calluses can occur with friction to any area of the skin. They are more likely to develop in people who:  Work with their hands.  Wear shoes that fit poorly, are too tight, or are high-heeled.  Have toe deformities. What are the signs or symptoms? Symptoms of a corn or callus include:  A hard growth on the skin.  Pain or tenderness under the skin.  Redness and swelling.  Increased discomfort while wearing tight-fitting shoes, if your feet are affected. If a  corn or callus becomes infected, symptoms may include:  Redness and swelling that gets worse.  Pain.  Fluid, blood, or pus draining from the corn or callus. How is this diagnosed? Corns and calluses may be diagnosed based on your symptoms, your medical history, and a physical exam. How is this treated? Treatment for corns and calluses may include:  Removing the cause of the friction or pressure. This may involve: ? Changing your shoes. ? Wearing shoe inserts (orthotics) or other protective layers in your shoes, such as a corn pad. ? Wearing gloves.  Applying medicine to the skin (topical medicine) to help soften skin in the hardened, thickened areas.  Removing layers of dead skin with a file to reduce the size of the corn or callus.  Removing the corn or callus with a scalpel or laser.  Taking antibiotic medicines, if your corn or callus is infected.  Having surgery, if a toe deformity is the cause. Follow these instructions at home:   Take over-the-counter and prescription medicines only as told by your health care provider.  If you were prescribed an antibiotic, take it as told by your health care provider. Do not stop taking it even if your condition starts to improve.  Wear shoes that fit well. Avoid wearing high-heeled shoes and shoes that are too tight or too loose.  Wear any padding, protective layers, gloves, or orthotics as told by your health care provider.  Soak your hands or feet and then use a file or pumice stone to soften your corn or callus. Do this as told by your health care provider.  Check your corn or callus every day for symptoms of infection. Contact a health care provider if you:  Notice that your symptoms do not improve with treatment.  Have redness or swelling that gets worse.  Notice that your corn or callus becomes painful.  Have fluid, blood, or pus coming from your corn or callus.  Have new symptoms. Summary  Corns are small areas of  thickened skin that occur on the top, sides, or tip of a toe.  Calluses are areas of thickened skin that can occur anywhere on the body, including the hands, fingers, palms, and soles of the feet. Calluses are usually larger than corns.  Corns and calluses are caused by rubbing (friction) or pressure, such as from shoes that are too tight or do not fit properly.  Treatment may include wearing any padding, protective layers, gloves, or orthotics as told by your health care provider. This information is not intended to replace advice given to you by your health care provider. Make sure you discuss any questions you have with your health care provider. Document Released: 03/01/2004 Document Revised: 04/08/2017 Document Reviewed: 04/08/2017 Elsevier Interactive Patient Education  2019 [employer]  
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
## 2018-07-28 DIAGNOSIS — H2511 Age-related nuclear cataract, right eye: Secondary | ICD-10-CM | POA: Diagnosis not present

## 2018-07-28 DIAGNOSIS — H25811 Combined forms of age-related cataract, right eye: Secondary | ICD-10-CM | POA: Diagnosis not present

## 2018-08-02 ENCOUNTER — Ambulatory Visit
Admission: RE | Admit: 2018-08-02 | Discharge: 2018-08-02 | Disposition: A | Discharge status: Home / Self Care | Payer: 59 | Source: Ambulatory Visit | Attending: Internal Medicine | Admitting: Internal Medicine

## 2018-08-02 DIAGNOSIS — R221 Localized swelling, mass and lump, neck: Secondary | ICD-10-CM | POA: Diagnosis not present

## 2018-08-03 ENCOUNTER — Other Ambulatory Visit: Payer: Self-pay | Admitting: Internal Medicine

## 2018-08-03 DIAGNOSIS — R221 Localized swelling, mass and lump, neck: Secondary | ICD-10-CM

## 2018-08-03 DIAGNOSIS — R590 Localized enlarged lymph nodes: Secondary | ICD-10-CM

## 2018-08-11 ENCOUNTER — Ambulatory Visit
Admission: RE | Admit: 2018-08-11 | Discharge: 2018-08-11 | Disposition: A | Discharge status: Home / Self Care | Payer: 59 | Source: Ambulatory Visit | Attending: Internal Medicine | Admitting: Internal Medicine

## 2018-08-11 DIAGNOSIS — R221 Localized swelling, mass and lump, neck: Secondary | ICD-10-CM

## 2018-08-11 DIAGNOSIS — R590 Localized enlarged lymph nodes: Secondary | ICD-10-CM

## 2018-08-11 MED ORDER — IOPAMIDOL (ISOVUE-300) INJECTION 61%
75.0000 mL | Freq: Once | INTRAVENOUS | Status: AC | PRN
Start: 2018-08-11 — End: 2018-08-11
  Administered 2018-08-11: 75 mL via INTRAVENOUS

## 2018-08-13 ENCOUNTER — Other Ambulatory Visit (HOSPITAL_COMMUNITY): Payer: Self-pay | Admitting: Internal Medicine

## 2018-08-13 DIAGNOSIS — R590 Localized enlarged lymph nodes: Secondary | ICD-10-CM

## 2018-08-24 ENCOUNTER — Other Ambulatory Visit: Payer: Self-pay | Admitting: Student

## 2018-08-25 ENCOUNTER — Other Ambulatory Visit: Payer: Self-pay

## 2018-08-25 ENCOUNTER — Ambulatory Visit (HOSPITAL_COMMUNITY)
Admission: RE | Admit: 2018-08-25 | Discharge: 2018-08-25 | Disposition: A | Discharge status: Home / Self Care | Payer: Medicare HMO | Source: Ambulatory Visit | Attending: Internal Medicine | Admitting: Internal Medicine

## 2018-08-25 DIAGNOSIS — Z79899 Other long term (current) drug therapy: Secondary | ICD-10-CM | POA: Insufficient documentation

## 2018-08-25 DIAGNOSIS — I10 Essential (primary) hypertension: Secondary | ICD-10-CM | POA: Insufficient documentation

## 2018-08-25 DIAGNOSIS — E039 Hypothyroidism, unspecified: Secondary | ICD-10-CM | POA: Diagnosis not present

## 2018-08-25 DIAGNOSIS — R590 Localized enlarged lymph nodes: Secondary | ICD-10-CM | POA: Diagnosis not present

## 2018-08-25 DIAGNOSIS — Z7982 Long term (current) use of aspirin: Secondary | ICD-10-CM | POA: Insufficient documentation

## 2018-08-25 DIAGNOSIS — Z79890 Hormone replacement therapy (postmenopausal): Secondary | ICD-10-CM | POA: Insufficient documentation

## 2018-08-25 LAB — CBC
HCT: 41.7 % (ref 36.0–46.0)
Hemoglobin: 13.5 g/dL (ref 12.0–15.0)
MCH: 27.1 pg (ref 26.0–34.0)
MCHC: 32.4 g/dL (ref 30.0–36.0)
MCV: 83.6 fL (ref 80.0–100.0)
Platelets: 280 10*3/uL (ref 150–400)
RBC: 4.99 MIL/uL (ref 3.87–5.11)
RDW: 14.6 % (ref 11.5–15.5)
WBC: 8.1 10*3/uL (ref 4.0–10.5)
nRBC: 0 % (ref 0.0–0.2)

## 2018-08-25 LAB — PROTIME-INR
INR: 1.1 (ref 0.8–1.2)
Prothrombin Time: 13.6 seconds (ref 11.4–15.2)

## 2018-08-25 LAB — APTT: aPTT: 29 seconds (ref 24–36)

## 2018-08-25 MED ORDER — SODIUM CHLORIDE 0.9 % IV SOLN: INTRAVENOUS | Status: DC

## 2018-08-25 MED ORDER — MIDAZOLAM HCL 2 MG/2ML IJ SOLN
INTRAMUSCULAR | Status: AC | PRN
  Administered 2018-08-25: 1 mg via INTRAVENOUS

## 2018-08-25 MED ORDER — FENTANYL CITRATE (PF) 100 MCG/2ML IJ SOLN
INTRAMUSCULAR | Status: AC
  Filled 2018-08-25: qty 2

## 2018-08-25 MED ORDER — LIDOCAINE HCL (PF) 1 % IJ SOLN
INTRAMUSCULAR | Status: AC
  Filled 2018-08-25: qty 30

## 2018-08-25 MED ORDER — MIDAZOLAM HCL 2 MG/2ML IJ SOLN
INTRAMUSCULAR | Status: AC
  Filled 2018-08-25: qty 2

## 2018-08-25 MED ORDER — FENTANYL CITRATE (PF) 100 MCG/2ML IJ SOLN
INTRAMUSCULAR | Status: AC | PRN
  Administered 2018-08-25: 50 ug via INTRAVENOUS

## 2018-08-25 NOTE — Procedures (Signed)
Interventional Radiology Procedure Note  Procedure: US guided core biopsy of right submandibular LN  Complications: None  Estimated Blood Loss: None  Recommendations: - DC home  Signed,  Sterling Big, MD
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
## 2018-08-25 NOTE — Discharge Instructions (Addendum)
Moderate Conscious Sedation, Adult, Care After °These instructions provide you with information about caring for yourself after your procedure. Your health care provider may also give you more specific instructions. Your treatment has been planned according to current medical practices, but problems sometimes occur. Call your health care provider if you have any problems or questions after your procedure. °What can I expect after the procedure? °After your procedure, it is common: °· To feel sleepy for several hours. °· To feel clumsy and have poor balance for several hours. °· To have poor judgment for several hours. °· To vomit if you eat too soon. °Follow these instructions at home: °For at least 24 hours after the procedure: ° °· Do not: °? Participate in activities where you could fall or become injured. °? Drive. °? Use heavy machinery. °? Drink alcohol. °? Take sleeping pills or medicines that cause drowsiness. °? Make important decisions or sign legal documents. °? Take care of children on your own. °· Rest. °Eating and drinking °· Follow the diet recommended by your health care provider. °· If you vomit: °? Drink water, juice, or soup when you can drink without vomiting. °? Make sure you have little or no nausea before eating solid foods. °General instructions °· Have a responsible adult stay with you until you are awake and alert. °· Take over-the-counter and prescription medicines only as told by your health care provider. °· If you smoke, do not smoke without supervision. °· Keep all follow-up visits as told by your health care provider. This is important. °Contact a health care provider if: °· You keep feeling nauseous or you keep vomiting. °· You feel light-headed. °· You develop a rash. °· You have a fever. °Get help right away if: °· You have trouble breathing. °This information is not intended to replace advice given to you by your health care provider. Make sure you discuss any questions you have  with your health care provider. °Document Released: 03/16/2013 Document Revised: 10/29/2015 Document Reviewed: 09/15/2015 °Elsevier Interactive Patient Education © 2019 [employer] °Needle Biopsy, Care After °These instructions tell you how to care for yourself after your procedure. Your doctor may also give you more specific instructions. Call your doctor if you have any problems or questions. °What can I expect after the procedure? °After the procedure, it is common to have: °· Soreness. °· Bruising. °· Mild pain. °Follow these instructions at home: ° °· Return to your normal activities as told by your doctor. Ask your doctor what activities are safe for you. °· Take over-the-counter and prescription medicines only as told by your doctor. °· Wash your hands with soap and water before you change your bandage (dressing). If you cannot use soap and water, use hand sanitizer. °· Follow instructions from your doctor about: °? How to take care of your puncture site. °? When and how to change your bandage. °? When to remove your bandage. °· Check your puncture site every day for signs of infection. Watch for: °? Redness, swelling, or pain. °? Fluid or blood.  °? Pus or a bad smell. °? Warmth. °· Do not take baths, swim, or use a hot tub until your doctor approves. Ask your doctor if you may take showers. You may only be allowed to take sponge baths. °· Keep all follow-up visits as told by your doctor. This is important. °Contact a doctor if you have: °· A fever. °· Redness, swelling, or pain at the puncture site, and it lasts longer than a   few days. °· Fluid, blood, or pus coming from the puncture site. °· Warmth coming from the puncture site. °Get help right away if: °· You have a lot of bleeding from the puncture site. °Summary °· After the procedure, it is common to have soreness, bruising, or mild pain at the puncture site. °· Check your puncture site every day for signs of infection, such as redness, swelling, or  pain. °· Get help right away if you have severe bleeding from your puncture site. °This information is not intended to replace advice given to you by your health care provider. Make sure you discuss any questions you have with your health care provider. °Document Released: 05/08/2008 Document Revised: 06/08/2017 Document Reviewed: 06/08/2017 °Elsevier Interactive Patient Education © 2019 [employer] ° °

## 2018-08-26 DIAGNOSIS — R2242 Localized swelling, mass and lump, left lower limb: Secondary | ICD-10-CM | POA: Diagnosis not present

## 2018-09-13 DIAGNOSIS — R590 Localized enlarged lymph nodes: Secondary | ICD-10-CM | POA: Diagnosis not present

## 2018-09-13 DIAGNOSIS — R221 Localized swelling, mass and lump, neck: Secondary | ICD-10-CM | POA: Insufficient documentation

## 2018-10-19 ENCOUNTER — Other Ambulatory Visit: Payer: Self-pay

## 2018-10-19 ENCOUNTER — Ambulatory Visit: Payer: Medicare HMO | Admitting: Podiatry

## 2018-10-19 VITALS — Temp 97.3°F

## 2018-10-19 DIAGNOSIS — B351 Tinea unguium: Secondary | ICD-10-CM | POA: Diagnosis not present

## 2018-10-19 DIAGNOSIS — I739 Peripheral vascular disease, unspecified: Secondary | ICD-10-CM | POA: Diagnosis not present

## 2018-10-19 DIAGNOSIS — L84 Corns and callosities: Secondary | ICD-10-CM

## 2018-10-19 DIAGNOSIS — M79675 Pain in left toe(s): Secondary | ICD-10-CM | POA: Diagnosis not present

## 2018-10-19 DIAGNOSIS — M79674 Pain in right toe(s): Secondary | ICD-10-CM

## 2018-10-19 NOTE — Patient Instructions (Signed)
Corns and Calluses Corns are small areas of thickened skin that occur on the top, sides, or tip of a toe. They contain a cone-shaped core with a point that can press on a nerve below. This causes pain.  Calluses are areas of thickened skin that can occur anywhere on the body, including the hands, fingers, palms, soles of the feet, and heels. Calluses are usually larger than corns. What are the causes? Corns and calluses are caused by rubbing (friction) or pressure, such as from shoes that are too tight or do not fit properly. What increases the risk? Corns are more likely to develop in people who have misshapen toes (toe deformities), such as hammer toes. Calluses can occur with friction to any area of the skin. They are more likely to develop in people who:  Work with their hands.  Wear shoes that fit poorly, are too tight, or are high-heeled.  Have toe deformities. What are the signs or symptoms? Symptoms of a corn or callus include:  A hard growth on the skin.  Pain or tenderness under the skin.  Redness and swelling.  Increased discomfort while wearing tight-fitting shoes, if your feet are affected. If a corn or callus becomes infected, symptoms may include:  Redness and swelling that gets worse.  Pain.  Fluid, blood, or pus draining from the corn or callus. How is this diagnosed? Corns and calluses may be diagnosed based on your symptoms, your medical history, and a physical exam. How is this treated? Treatment for corns and calluses may include:  Removing the cause of the friction or pressure. This may involve: ? Changing your shoes. ? Wearing shoe inserts (orthotics) or other protective layers in your shoes, such as a corn pad. ? Wearing gloves.  Applying medicine to the skin (topical medicine) to help soften skin in the hardened, thickened areas.  Removing layers of dead skin with a file to reduce the size of the corn or callus.  Removing the corn or callus with a  scalpel or laser.  Taking antibiotic medicines, if your corn or callus is infected.  Having surgery, if a toe deformity is the cause. Follow these instructions at home:   Take over-the-counter and prescription medicines only as told by your health care provider.  If you were prescribed an antibiotic, take it as told by your health care provider. Do not stop taking it even if your condition starts to improve.  Wear shoes that fit well. Avoid wearing high-heeled shoes and shoes that are too tight or too loose.  Wear any padding, protective layers, gloves, or orthotics as told by your health care provider.  Soak your hands or feet and then use a file or pumice stone to soften your corn or callus. Do this as told by your health care provider.  Check your corn or callus every day for symptoms of infection. Contact a health care provider if you:  Notice that your symptoms do not improve with treatment.  Have redness or swelling that gets worse.  Notice that your corn or callus becomes painful.  Have fluid, blood, or pus coming from your corn or callus.  Have new symptoms. Summary  Corns are small areas of thickened skin that occur on the top, sides, or tip of a toe.  Calluses are areas of thickened skin that can occur anywhere on the body, including the hands, fingers, palms, and soles of the feet. Calluses are usually larger than corns.  Corns and calluses are caused by   rubbing (friction) or pressure, such as from shoes that are too tight or do not fit properly.  Treatment may include wearing any padding, protective layers, gloves, or orthotics as told by your health care provider. This information is not intended to replace advice given to you by your health care provider. Make sure you discuss any questions you have with your health care provider. Document Released: 03/01/2004 Document Revised: 04/08/2017 Document Reviewed: 04/08/2017 Elsevier Interactive Patient Education   2019 [employer]  Onychomycosis/Fungal Toenails  WHAT IS IT? An infection that lies within the keratin of your nail plate that is caused by a fungus.  WHY ME? Fungal infections affect all ages, sexes, races, and creeds.  There may be many factors that predispose you to a fungal infection such as age, coexisting medical conditions such as diabetes, or an autoimmune disease; stress, medications, fatigue, genetics, etc.  Bottom line: fungus thrives in a warm, moist environment and your shoes offer such a location.  IS IT CONTAGIOUS? Theoretically, yes.  You do not want to share shoes, nail clippers or files with someone who has fungal toenails.  Walking around barefoot in the same room or sleeping in the same bed is unlikely to transfer the organism.  It is important to realize, however, that fungus can spread easily from one nail to the next on the same foot.  HOW DO WE TREAT THIS?  There are several ways to treat this condition.  Treatment may depend on many factors such as age, medications, pregnancy, liver and kidney conditions, etc.  It is best to ask your doctor which options are available to you.  1. No treatment.   Unlike many other medical concerns, you can live with this condition.  However for many people this can be a painful condition and may lead to ingrown toenails or a bacterial infection.  It is recommended that you keep the nails cut short to help reduce the amount of fungal nail. 2. Topical treatment.  These range from herbal remedies to prescription strength nail lacquers.  About 40-50% effective, topicals require twice daily application for approximately 9 to 12 months or until an entirely new nail has grown out.  The most effective topicals are medical grade medications available through physicians offices. 3. Oral antifungal medications.  With an 80-90% cure rate, the most common oral medication requires 3 to 4 months of therapy and stays in your system for a year as the new nail  grows out.  Oral antifungal medications do require blood work to make sure it is a safe drug for you.  A liver function panel will be performed prior to starting the medication and after the first month of treatment.  It is important to have the blood work performed to avoid any harmful side effects.  In general, this medication safe but blood work is required. 4. Laser Therapy.  This treatment is performed by applying a specialized laser to the affected nail plate.  This therapy is noninvasive, fast, and non-painful.  It is not covered by insurance and is therefore, out of pocket.  The results have been very good with a 80-95% cure rate.  [facility] is the only practice in the area to offer this therapy. 5. Permanent Nail Avulsion.  Removing the entire nail so that a new nail will not grow back. 

## 2018-10-24 ENCOUNTER — Encounter: Payer: Self-pay | Admitting: Podiatry

## 2018-10-24 NOTE — Progress Notes (Signed)
Subjective: Jessica Peterson presents today with painful, thick toenails 1-5 b/l that she cannot cut and which interfere with daily activities.  Pain is aggravated when wearing enclosed shoe gear.    Creola Corn, MD is her PCP.    Current Outpatient Medications:  .  aspirin EC 81 MG tablet, Take 81 mg by mouth daily., Disp: , Rfl:  .  cholecalciferol (VITAMIN D3) 25 MCG (1000 UT) tablet, Take 1,000 Units by mouth daily., Disp: , Rfl:  .  hydrochlorothiazide (MICROZIDE) 12.5 MG capsule, Take 12.5 mg by mouth daily. , Disp: , Rfl:  .  ketorolac (ACULAR) 0.5 % ophthalmic solution, Place 1 drop into the right eye 2 (two) times daily. , Disp: , Rfl:  .  losartan (COZAAR) 100 MG tablet, Take 100 mg by mouth daily. , Disp: , Rfl:  .  prednisoLONE acetate (PRED FORTE) 1 % ophthalmic suspension, Place 1 drop into the right eye 2 (two) times daily. , Disp: , Rfl:  .  SYNTHROID 112 MCG tablet, Take 112 mcg by mouth daily before breakfast. , Disp: , Rfl:  .  vitamin B-12 (CYANOCOBALAMIN) 1000 MCG tablet, Take 1,000 mcg by mouth daily., Disp: , Rfl:   Allergies  Allergen Reactions  . Penicillins     REACTION: rash Did it involve swelling of the face/tongue/throat, SOB, or low BP? Unknown Did it involve sudden or severe rash/hives, skin peeling, or any reaction on the inside of your mouth or nose? Yes Did you need to seek medical attention at a hospital or doctor's office? No When did it last happen?10 + YEARS If all above answers are "NO", may proceed with cephalosporin use.     Objective:  Vascular Examination: Capillary refill time immediate x 10 digits  Dorsalis pedis 2/4 b/l.  Posterior tibial pulses diminished b/l.  Digital hair absent x 10 digits.  Skin temperature gradient warm to cool b/l.  Dermatological Examination: Skin slightly thin and atrophic bilaterally.  Toenails 1-5 b/l discolored, thick, dystrophic with subungual debris and pain with palpation to nailbeds due  to thickness of nails.  Hyperkeratotic lesion noted dorsal 5th digit PIPJ with tenderness to palpation. No edema, no erythema, no drainage, no flocculence. Hyperkeratotic lesion b/l hallux. No erythema, no edema, no drainage, no flocculence noted.  No open wounds noted bilaterally.  Musculoskeletal: Muscle strength 5/5 to all LE muscle groups  HAV with bunion b/l.  No pain, crepitus or joint limitation noted with ROM.   Neurological: Sensation intact with 10 gram monofilament.  Vibratory sensation intact.  Assessment: 1.  Painful onychomycosis toenails 1-5 b/l  2.  Calluses b/l hallux 3.  Corns b/l 5th digits 4.  Early PAD  Plan: 1. Toenails 1-5 b/l were debrided in length and girth without iatrogenic bleeding. 2. Patient to continue soft, supportive shoe gear daily. 3. Patient to report any pedal injuries to medical professional immediately. 4. Follow up 3 months.  5. Patient/POA to call should there be a concern in the interim.

## 2019-01-11 DIAGNOSIS — Z9189 Other specified personal risk factors, not elsewhere classified: Secondary | ICD-10-CM | POA: Diagnosis not present

## 2019-01-11 DIAGNOSIS — K8681 Exocrine pancreatic insufficiency: Secondary | ICD-10-CM | POA: Diagnosis not present

## 2019-01-11 DIAGNOSIS — E039 Hypothyroidism, unspecified: Secondary | ICD-10-CM | POA: Diagnosis not present

## 2019-01-11 DIAGNOSIS — R221 Localized swelling, mass and lump, neck: Secondary | ICD-10-CM | POA: Diagnosis not present

## 2019-01-11 DIAGNOSIS — K029 Dental caries, unspecified: Secondary | ICD-10-CM | POA: Insufficient documentation

## 2019-01-11 DIAGNOSIS — I10 Essential (primary) hypertension: Secondary | ICD-10-CM | POA: Diagnosis not present

## 2019-01-18 ENCOUNTER — Other Ambulatory Visit: Payer: Self-pay

## 2019-01-18 ENCOUNTER — Ambulatory Visit (INDEPENDENT_AMBULATORY_CARE_PROVIDER_SITE_OTHER): Payer: Medicare HMO | Admitting: Podiatry

## 2019-01-18 ENCOUNTER — Encounter: Payer: Self-pay | Admitting: Podiatry

## 2019-01-18 VITALS — Temp 98.3°F

## 2019-01-18 DIAGNOSIS — B351 Tinea unguium: Secondary | ICD-10-CM

## 2019-01-18 DIAGNOSIS — L84 Corns and callosities: Secondary | ICD-10-CM

## 2019-01-18 DIAGNOSIS — M79674 Pain in right toe(s): Secondary | ICD-10-CM

## 2019-01-18 DIAGNOSIS — M79675 Pain in left toe(s): Secondary | ICD-10-CM | POA: Diagnosis not present

## 2019-01-18 DIAGNOSIS — I739 Peripheral vascular disease, unspecified: Secondary | ICD-10-CM | POA: Diagnosis not present

## 2019-01-18 NOTE — Patient Instructions (Signed)
Corns and Calluses Corns are small areas of thickened skin that occur on the top, sides, or tip of a toe. They contain a cone-shaped core with a point that can press on a nerve below. This causes pain.  Calluses are areas of thickened skin that can occur anywhere on the body, including the hands, fingers, palms, soles of the feet, and heels. Calluses are usually larger than corns. What are the causes? Corns and calluses are caused by rubbing (friction) or pressure, such as from shoes that are too tight or do not fit properly. What increases the risk? Corns are more likely to develop in people who have misshapen toes (toe deformities), such as hammer toes. Calluses can occur with friction to any area of the skin. They are more likely to develop in people who:  Work with their hands.  Wear shoes that fit poorly, are too tight, or are high-heeled.  Have toe deformities. What are the signs or symptoms? Symptoms of a corn or callus include:  A hard growth on the skin.  Pain or tenderness under the skin.  Redness and swelling.  Increased discomfort while wearing tight-fitting shoes, if your feet are affected. If a corn or callus becomes infected, symptoms may include:  Redness and swelling that gets worse.  Pain.  Fluid, blood, or pus draining from the corn or callus. How is this diagnosed? Corns and calluses may be diagnosed based on your symptoms, your medical history, and a physical exam. How is this treated? Treatment for corns and calluses may include:  Removing the cause of the friction or pressure. This may involve: ? Changing your shoes. ? Wearing shoe inserts (orthotics) or other protective layers in your shoes, such as a corn pad. ? Wearing gloves.  Applying medicine to the skin (topical medicine) to help soften skin in the hardened, thickened areas.  Removing layers of dead skin with a file to reduce the size of the corn or callus.  Removing the corn or callus with a  scalpel or laser.  Taking antibiotic medicines, if your corn or callus is infected.  Having surgery, if a toe deformity is the cause. Follow these instructions at home:   Take over-the-counter and prescription medicines only as told by your health care provider.  If you were prescribed an antibiotic, take it as told by your health care provider. Do not stop taking it even if your condition starts to improve.  Wear shoes that fit well. Avoid wearing high-heeled shoes and shoes that are too tight or too loose.  Wear any padding, protective layers, gloves, or orthotics as told by your health care provider.  Soak your hands or feet and then use a file or pumice stone to soften your corn or callus. Do this as told by your health care provider.  Check your corn or callus every day for symptoms of infection. Contact a health care provider if you:  Notice that your symptoms do not improve with treatment.  Have redness or swelling that gets worse.  Notice that your corn or callus becomes painful.  Have fluid, blood, or pus coming from your corn or callus.  Have new symptoms. Summary  Corns are small areas of thickened skin that occur on the top, sides, or tip of a toe.  Calluses are areas of thickened skin that can occur anywhere on the body, including the hands, fingers, palms, and soles of the feet. Calluses are usually larger than corns.  Corns and calluses are caused by   rubbing (friction) or pressure, such as from shoes that are too tight or do not fit properly.  Treatment may include wearing any padding, protective layers, gloves, or orthotics as told by your health care provider. This information is not intended to replace advice given to you by your health care provider. Make sure you discuss any questions you have with your health care provider. Document Released: 03/01/2004 Document Revised: 09/15/2018 Document Reviewed: 04/08/2017 Elsevier Patient Education  2020 [employer]  Inc[employer]   Onychomycosis/Fungal Toenails  WHAT IS IT? An infection that lies within the keratin of your nail plate that is caused by a fungus.  WHY ME? Fungal infections affect all ages, sexes, races, and creeds.  There may be many factors that predispose you to a fungal infection such as age, coexisting medical conditions such as diabetes, or an autoimmune disease; stress, medications, fatigue, genetics, etc.  Bottom line: fungus thrives in a warm, moist environment and your shoes offer such a location.  IS IT CONTAGIOUS? Theoretically, yes.  You do not want to share shoes, nail clippers or files with someone who has fungal toenails.  Walking around barefoot in the same room or sleeping in the same bed is unlikely to transfer the organism.  It is important to realize, however, that fungus can spread easily from one nail to the next on the same foot.  HOW DO WE TREAT THIS?  There are several ways to treat this condition.  Treatment may depend on many factors such as age, medications, pregnancy, liver and kidney conditions, etc.  It is best to ask your doctor which options are available to you.  1. No treatment.   Unlike many other medical concerns, you can live with this condition.  However for many people this can be a painful condition and may lead to ingrown toenails or a bacterial infection.  It is recommended that you keep the nails cut short to help reduce the amount of fungal nail. 2. Topical treatment.  These range from herbal remedies to prescription strength nail lacquers.  About 40-50% effective, topicals require twice daily application for approximately 9 to 12 months or until an entirely new nail has grown out.  The most effective topicals are medical grade medications available through physicians offices. 3. Oral antifungal medications.  With an 80-90% cure rate, the most common oral medication requires 3 to 4 months of therapy and stays in your system for a year as the new nail grows out.   Oral antifungal medications do require blood work to make sure it is a safe drug for you.  A liver function panel will be performed prior to starting the medication and after the first month of treatment.  It is important to have the blood work performed to avoid any harmful side effects.  In general, this medication safe but blood work is required. 4. Laser Therapy.  This treatment is performed by applying a specialized laser to the affected nail plate.  This therapy is noninvasive, fast, and non-painful.  It is not covered by insurance and is therefore, out of pocket.  The results have been very good with a 80-95% cure rate.  [facility] is the only practice in the area to offer this therapy. 5. Permanent Nail Avulsion.  Removing the entire nail so that a new nail will not grow back. 

## 2019-01-25 DIAGNOSIS — R221 Localized swelling, mass and lump, neck: Secondary | ICD-10-CM | POA: Diagnosis not present

## 2019-01-26 NOTE — Progress Notes (Signed)
Subjective:  Jessica Peterson presents to clinic today with cc of  painful, thick, discolored, elongated toenails 1-5 b/l and calluses that become tender and cannot cut because of thickness. Pain is aggravated when wearing enclosed shoe gear.  She voices no other new pedal problems on today's visit.   Current Outpatient Medications:  .  aspirin EC 81 MG tablet, Take 81 mg by mouth daily., Disp: , Rfl:  .  chlorhexidine (PERIDEX) 0.12 % solution, SWISH AND SPIT 15 ML TWICE A DAY, Disp: , Rfl:  .  cholecalciferol (VITAMIN D3) 25 MCG (1000 UT) tablet, Take 1,000 Units by mouth daily., Disp: , Rfl:  .  hydrochlorothiazide (MICROZIDE) 12.5 MG capsule, Take 12.5 mg by mouth daily. , Disp: , Rfl:  .  ketorolac (ACULAR) 0.5 % ophthalmic solution, Place 1 drop into the right eye 2 (two) times daily. , Disp: , Rfl:  .  losartan (COZAAR) 100 MG tablet, Take 100 mg by mouth daily. , Disp: , Rfl:  .  prednisoLONE acetate (PRED FORTE) 1 % ophthalmic suspension, Place 1 drop into the right eye 2 (two) times daily. , Disp: , Rfl:  .  SYNTHROID 112 MCG tablet, Take 112 mcg by mouth daily before breakfast. , Disp: , Rfl:  .  vitamin B-12 (CYANOCOBALAMIN) 1000 MCG tablet, Take 1,000 mcg by mouth daily., Disp: , Rfl:    Allergies  Allergen Reactions  . Penicillins     REACTION: rash Did it involve swelling of the face/tongue/throat, SOB, or low BP? Unknown Did it involve sudden or severe rash/hives, skin peeling, or any reaction on the inside of your mouth or nose? Yes Did you need to seek medical attention at a hospital or doctor's office? No When did it last happen?10 + YEARS If all above answers are "NO", may proceed with cephalosporin use.      Objective: Vitals:   01/18/19 1005  Temp: 98.3 F (36.8 C)    Physical Examination: Essentially unchanged from last visit  Vascular Examination: Capillary refill time immediate x 10 digits.  Diminished DP/PT pulses b/l.  Digital hair absent  bilaterally.  No edema noted b/l.  Skin temperature gradient warm to cool bilaterally.  Dermatological Examination: Skin slightly thin and atrophic bilaterally.  No open wounds b/l.  No interdigital macerations noted b/l.  Elongated, thick, discolored brittle toenails with subungual debris and pain on dorsal palpation of nailbeds 1-5 b/l.  Hyperkeratotic lesion bilateral hallux bilateral fifth digits with tenderness to palpation. No edema, no erythema, no drainage, no flocculence.  Musculoskeletal Examination: Muscle strength 5/5 to all muscle groups b/l.  Hallux abductovalgus with bunion deformity bilaterally.  Hammertoe fifth digit bilaterally.  No pain, crepitus or joint discomfort with active/passive ROM.  Neurological Examination: Sensation intact 5/5 b/l with 10 gram monofilament.  Vibratory sensation intact b/l.  Assessment: Mycotic nail infection with pain 1-5 b/l Calluses bilateral hallux Corns bilateral fifth digits PAD  Plan: 1. Toenails 1-5 b/l were debrided in length and girth without iatrogenic laceration. 2. Calluses pared bilateral hallux utilizing sterile scalpel blade without incident. Corn(s) bilateral fifth digits pared utilizing sterile scalpel blade without incident. 3. Continue soft, supportive shoe gear daily. 3.  Report any pedal injuries to medical professional. 4.  Follow up 3 months. 5.  Patient/POA to call should there be a question/concern in there interim.

## 2019-04-22 ENCOUNTER — Emergency Department (HOSPITAL_COMMUNITY): Admission: EM | Admit: 2019-04-22 | Discharge: 2019-04-22 | Discharge status: Absent without leave | Payer: Medicare HMO

## 2019-04-22 ENCOUNTER — Ambulatory Visit: Payer: Medicare HMO | Admitting: Podiatry

## 2019-04-25 ENCOUNTER — Telehealth: Payer: Self-pay | Admitting: Podiatry

## 2019-04-27 ENCOUNTER — Other Ambulatory Visit: Payer: Self-pay

## 2019-04-27 ENCOUNTER — Ambulatory Visit (INDEPENDENT_AMBULATORY_CARE_PROVIDER_SITE_OTHER): Payer: Medicare Other | Admitting: Podiatry

## 2019-04-27 ENCOUNTER — Encounter: Payer: Self-pay | Admitting: Podiatry

## 2019-04-27 DIAGNOSIS — M79675 Pain in left toe(s): Secondary | ICD-10-CM | POA: Diagnosis not present

## 2019-04-27 DIAGNOSIS — M79674 Pain in right toe(s): Secondary | ICD-10-CM | POA: Diagnosis not present

## 2019-04-27 DIAGNOSIS — B351 Tinea unguium: Secondary | ICD-10-CM

## 2019-04-27 DIAGNOSIS — L84 Corns and callosities: Secondary | ICD-10-CM | POA: Diagnosis not present

## 2019-04-27 NOTE — Progress Notes (Signed)
Complaint:  Visit Type: Patient returns to my office for continued preventative foot care services. Complaint: Patient states" my nails have grown long and thick and become painful to walk and wear shoes" Patient also has painful calluses both feet. The patient presents for preventative foot care services.  Podiatric Exam: Vascular: dorsalis pedis and posterior tibial pulses are weakly  palpable bilateral. Capillary return is immediate. Temperature gradient is WNL. Skin turgor WNL  Sensorium: Normal Semmes Weinstein monofilament test. Normal tactile sensation bilaterally. Nail Exam: Pt has thick disfigured discolored nails with subungual debris noted bilateral entire nail hallux through fifth toenails Ulcer Exam: There is no evidence of ulcer or pre-ulcerative changes or infection. Orthopedic Exam: Muscle tone and strength are WNL. No limitations in general ROM. No crepitus or effusions noted. Foot type and digits show no abnormalities. HAV  B/L. Skin: No Porokeratosis. No infection or ulcers.  Heloma durum fifth toe right foot.  Pinch callus  B/L.  Diagnosis:  Onychomycosis, , Pain in right toe, pain in left toes   Callus  B/L.  Treatment & Plan Procedures and Treatment: Consent by patient was obtained for treatment procedures.   Debridement of mycotic and hypertrophic toenails, 1 through 5 bilateral and clearing of subungual debris. No ulceration, no infection noted. Debride callus  B/L. Return Visit-Office Procedure: Patient instructed to return to the office for a follow up visit 3 months for continued evaluation and treatment.    Gardiner Barefoot DPM

## 2019-07-19 DIAGNOSIS — M79601 Pain in right arm: Secondary | ICD-10-CM | POA: Insufficient documentation

## 2019-07-27 ENCOUNTER — Ambulatory Visit: Payer: Medicare Other | Admitting: Podiatry

## 2019-08-12 ENCOUNTER — Ambulatory Visit (INDEPENDENT_AMBULATORY_CARE_PROVIDER_SITE_OTHER): Payer: Medicare Other | Admitting: Podiatry

## 2019-08-12 ENCOUNTER — Other Ambulatory Visit: Payer: Self-pay

## 2019-08-12 ENCOUNTER — Encounter: Payer: Self-pay | Admitting: Podiatry

## 2019-08-12 VITALS — Temp 97.2°F

## 2019-08-12 DIAGNOSIS — L84 Corns and callosities: Secondary | ICD-10-CM

## 2019-08-12 DIAGNOSIS — B351 Tinea unguium: Secondary | ICD-10-CM | POA: Diagnosis not present

## 2019-08-12 DIAGNOSIS — M79674 Pain in right toe(s): Secondary | ICD-10-CM | POA: Diagnosis not present

## 2019-08-12 DIAGNOSIS — M79675 Pain in left toe(s): Secondary | ICD-10-CM

## 2019-08-12 DIAGNOSIS — I739 Peripheral vascular disease, unspecified: Secondary | ICD-10-CM | POA: Diagnosis not present

## 2019-08-12 NOTE — Patient Instructions (Signed)

## 2019-08-18 NOTE — Progress Notes (Signed)
Subjective: Jessica Peterson presents today for follow up of corn(s) b/l 5th digits and callus(es) b/l hallux and painful mycotic nails b/l.  Pain interferes with ambulation. Aggravating factors include wearing enclosed shoe gear.   She has h/o diminished pulses. She voices no new pedal problems on today's visit.  Allergies  Allergen Reactions  . Penicillins     REACTION: rash Did it involve swelling of the face/tongue/throat, SOB, or low BP? Unknown Did it involve sudden or severe rash/hives, skin peeling, or any reaction on the inside of your mouth or nose? Yes Did you need to seek medical attention at a hospital or doctor's office? No When did it last happen?10 + YEARS If all above answers are "NO", may proceed with cephalosporin use.      Objective: Vitals:   08/12/19 1539  Temp: (!) 97.2 F (36.2 C)    Pt 86 y.o. year old AA female  WD, WN in NAD. AAO x 3.   Vascular Examination:  Capillary refill time to digits immediate b/l. Diminished pedal pulses b/l. Pedal hair absent b/l. Skin temperature gradient warm to cool b/l.  Dermatological Examination: Pedal skin is thin shiny, atrophic bilaterally. No open wounds bilaterally. Toenails 1-5 b/l elongated, dystrophic, thickened, crumbly with subungual debris and tenderness to dorsal palpation. Hyperkeratotic lesion(s) b/l 5th digits and b/l hallux.  No erythema, no edema, no drainage, no flocculence.  Musculoskeletal: Normal muscle strength 5/5 to all lower extremity muscle groups bilaterally, no pain crepitus or joint limitation noted with ROM b/l, bunion deformity noted b/l and hammertoes noted to the  L 5th toe and R 5th toe  Neurological: Protective sensation intact 5/5 intact bilaterally with 10g monofilament b/l Vibratory sensation intact b/l   Assessment: 1. Pain due to onychomycosis of toenails of both feet   2. Corns and callosities   3. PAD (peripheral artery disease) (HCC)    Plan: -Toenails 1-5 b/l were  debrided in length and girth with sterile nail nippers and dremel without iatrogenic bleeding.  -Corn(s) debrided b/l 5th digits without complication or incident. Total number debrided=2. -Calluses b/l hallux were debrided without complication or incident. Total number debrided =2. -Patient to continue soft, supportive shoe gear daily. -Patient to report any pedal injuries to medical professional immediately. -Patient/POA to call should there be question/concern in the interim.  Return in about 3 months (around 11/12/2019) for nail and callus trim.

## 2019-11-15 ENCOUNTER — Ambulatory Visit (INDEPENDENT_AMBULATORY_CARE_PROVIDER_SITE_OTHER): Payer: Medicare Other | Admitting: Podiatry

## 2019-11-15 ENCOUNTER — Other Ambulatory Visit: Payer: Self-pay

## 2019-11-15 ENCOUNTER — Encounter: Payer: Self-pay | Admitting: Podiatry

## 2019-11-15 DIAGNOSIS — I739 Peripheral vascular disease, unspecified: Secondary | ICD-10-CM | POA: Diagnosis not present

## 2019-11-15 DIAGNOSIS — B351 Tinea unguium: Secondary | ICD-10-CM

## 2019-11-15 DIAGNOSIS — M79674 Pain in right toe(s): Secondary | ICD-10-CM

## 2019-11-15 DIAGNOSIS — M79675 Pain in left toe(s): Secondary | ICD-10-CM | POA: Diagnosis not present

## 2019-11-15 NOTE — Progress Notes (Signed)
This patient returns to the office for evaluation and treatment of long thick painful nails .  This patient is unable to trim her own nails since the patient cannot reach her feet.  Patient says the nails are painful walking and wearing her shoes.  She returns for preventive foot care services.  General Appearance  Alert, conversant and in no acute stress.  Vascular  Dorsalis pedis and posterior tibial  pulses are weakly  palpable  bilaterally.  Capillary return is within normal limits  bilaterally. Temperature is within normal limits  bilaterally.  Neurologic  Senn-Weinstein monofilament wire test within normal limits  bilaterally. Muscle power within normal limits bilaterally.  Nails Thick disfigured discolored nails with subungual debris  from hallux to fifth toes bilaterally. No evidence of bacterial infection or drainage bilaterally.  Orthopedic  No limitations of motion  feet .  No crepitus or effusions noted. HAV  B/L.    Skin  normotropic skin with no porokeratosis noted bilaterally.  No signs of infections or ulcers noted.     Onychomycosis  Pain in toes right foot  Pain in toes left foot  Debridement  of nails  1-5  B/L with a nail nipper.  Nails were then filed using a dremel tool with no incidents.    RTC 10 weeks    Helane Gunther DPM

## 2020-02-24 ENCOUNTER — Other Ambulatory Visit: Payer: Self-pay

## 2020-02-24 ENCOUNTER — Ambulatory Visit (INDEPENDENT_AMBULATORY_CARE_PROVIDER_SITE_OTHER): Payer: Medicare Other | Admitting: Podiatry

## 2020-02-24 ENCOUNTER — Encounter: Payer: Self-pay | Admitting: Podiatry

## 2020-02-24 DIAGNOSIS — I739 Peripheral vascular disease, unspecified: Secondary | ICD-10-CM | POA: Diagnosis not present

## 2020-02-24 DIAGNOSIS — M79675 Pain in left toe(s): Secondary | ICD-10-CM

## 2020-02-24 DIAGNOSIS — M79674 Pain in right toe(s): Secondary | ICD-10-CM | POA: Diagnosis not present

## 2020-02-24 DIAGNOSIS — L84 Corns and callosities: Secondary | ICD-10-CM

## 2020-02-24 DIAGNOSIS — B351 Tinea unguium: Secondary | ICD-10-CM

## 2020-02-26 NOTE — Progress Notes (Signed)
Subjective: Jessica Peterson presents today for follow up of corn(s) b/l 5th digits and callus(es) b/l hallux and painful mycotic nails b/l.  Pain interferes with ambulation. Aggravating factors include wearing enclosed shoe gear.   She has h/o diminished pulses. She voices no new pedal problems on today's visit.  Allergies  Allergen Reactions  . Penicillins     REACTION: rash Did it involve swelling of the face/tongue/throat, SOB, or low BP? Unknown Did it involve sudden or severe rash/hives, skin peeling, or any reaction on the inside of your mouth or nose? Yes Did you need to seek medical attention at a hospital or doctor's office? No When did it last happen?10 + YEARS If all above answers are "NO", may proceed with cephalosporin use.      Objective: There were no vitals filed for this visit.  Pt 86 y.o. year old AA female  WD, WN in NAD. AAO x 3.   Vascular Examination:  Capillary refill time to digits immediate b/l. Diminished pedal pulses b/l. Pedal hair absent b/l. Skin temperature gradient warm to cool b/l.  Dermatological Examination: Pedal skin is thin shiny, atrophic bilaterally. No open wounds bilaterally. Toenails 1-5 b/l elongated, dystrophic, thickened, crumbly with subungual debris and tenderness to dorsal palpation. Hyperkeratotic lesion(s) b/l 5th digits and b/l hallux.  No erythema, no edema, no drainage, no flocculence.  Musculoskeletal: Normal muscle strength 5/5 to all lower extremity muscle groups bilaterally, no pain crepitus or joint limitation noted with ROM b/l, bunion deformity noted b/l and hammertoes noted to the  L 5th toe and R 5th toe  Neurological: Protective sensation intact 5/5 intact bilaterally with 10g monofilament b/l Vibratory sensation intact b/l   Assessment: 1. Pain due to onychomycosis of toenails of both feet   2. Corns and callosities   3. PAD (peripheral artery disease) (HCC)    Plan: -Toenails 1-5 b/l were debrided in length  and girth with sterile nail nippers and dremel without iatrogenic bleeding.  -Corn(s) debrided b/l 5th digits without complication or incident. Total number debrided=2. -Calluses b/l hallux were debrided without complication or incident. Total number debrided =2. -Patient to continue soft, supportive shoe gear daily. -Patient to report any pedal injuries to medical professional immediately. -Patient/POA to call should there be question/concern in the interim.  Return in about 3 months (around 05/25/2020).

## 2020-05-21 ENCOUNTER — Encounter: Payer: Self-pay | Admitting: Podiatry

## 2020-05-21 ENCOUNTER — Other Ambulatory Visit: Payer: Self-pay

## 2020-05-21 ENCOUNTER — Ambulatory Visit (INDEPENDENT_AMBULATORY_CARE_PROVIDER_SITE_OTHER): Payer: Medicare Other | Admitting: Podiatry

## 2020-05-21 DIAGNOSIS — B351 Tinea unguium: Secondary | ICD-10-CM | POA: Diagnosis not present

## 2020-05-21 DIAGNOSIS — M79675 Pain in left toe(s): Secondary | ICD-10-CM

## 2020-05-21 DIAGNOSIS — M79674 Pain in right toe(s): Secondary | ICD-10-CM | POA: Diagnosis not present

## 2020-05-21 DIAGNOSIS — I739 Peripheral vascular disease, unspecified: Secondary | ICD-10-CM

## 2020-05-21 DIAGNOSIS — L84 Corns and callosities: Secondary | ICD-10-CM | POA: Diagnosis not present

## 2020-05-27 NOTE — Progress Notes (Signed)
Subjective: Jessica Peterson presents today for follow up of corn(s) b/l 5th digits and callus(es) b/l hallux and painful mycotic nails b/l.  Pain interferes with ambulation. Aggravating factors include wearing enclosed shoe gear.   She voices no new pedal problems on today's visit. Her husband is present on today's visit.  PCP is Dr. Creola Corn.   Allergies  Allergen Reactions  . Penicillins     REACTION: rash Did it involve swelling of the face/tongue/throat, SOB, or low BP? Unknown Did it involve sudden or severe rash/hives, skin peeling, or any reaction on the inside of your mouth or nose? Yes Did you need to seek medical attention at a hospital or doctor's office? No When did it last happen?10 + YEARS If all above answers are "NO", may proceed with cephalosporin use.     Objective: There were no vitals filed for this visit.  Pt 86 y.o. year old AA female  WD, WN in NAD. AAO x 3.   Vascular Examination:  Capillary refill time to digits immediate b/l. Diminished pedal pulses b/l. Pedal hair absent b/l. Skin temperature gradient warm to cool b/l.   Dermatological Examination: Pedal skin is thin shiny, atrophic bilaterally. No open wounds bilaterally. Toenails 1-5 b/l elongated, dystrophic, thickened, crumbly with subungual debris and tenderness to dorsal palpation. Hyperkeratotic lesion(s) b/l 5th digits and b/l hallux.  No erythema, no edema, no drainage, no flocculence.  Musculoskeletal: Normal muscle strength 5/5 to all lower extremity muscle groups bilaterally, no pain crepitus or joint limitation noted with ROM b/l, bunion deformity noted b/l and hammertoes noted to the  L 5th toe and R 5th toe  Neurological: Protective sensation intact 5/5 intact bilaterally with 10g monofilament b/l Vibratory sensation intact b/l   Assessment: 1. Pain due to onychomycosis of toenails of both feet   2. Corns and callosities   3. PAD (peripheral artery disease) (HCC)     Plan: -Toenails 1-5 b/l were debrided in length and girth with sterile nail nippers and dremel without iatrogenic bleeding.  -Corn(s) debrided b/l 5th digits without complication or incident. Total number debrided=2. -Calluses b/l hallux were debrided without complication or incident. Total number debrided =2. -Patient to continue soft, supportive shoe gear daily. -Patient to report any pedal injuries to medical professional immediately. -Patient/POA to call should there be question/concern in the interim.  Return in about 3 months (around 08/19/2020) for toenail debridement w/corn(s)/callus(es).

## 2020-08-22 ENCOUNTER — Encounter: Payer: Self-pay | Admitting: Podiatry

## 2020-08-22 ENCOUNTER — Ambulatory Visit (INDEPENDENT_AMBULATORY_CARE_PROVIDER_SITE_OTHER): Payer: Medicare Other | Admitting: Podiatry

## 2020-08-22 ENCOUNTER — Other Ambulatory Visit: Payer: Self-pay

## 2020-08-22 DIAGNOSIS — L84 Corns and callosities: Secondary | ICD-10-CM | POA: Diagnosis not present

## 2020-08-22 DIAGNOSIS — B351 Tinea unguium: Secondary | ICD-10-CM

## 2020-08-22 DIAGNOSIS — I739 Peripheral vascular disease, unspecified: Secondary | ICD-10-CM

## 2020-08-22 DIAGNOSIS — M79674 Pain in right toe(s): Secondary | ICD-10-CM | POA: Diagnosis not present

## 2020-08-22 DIAGNOSIS — M79675 Pain in left toe(s): Secondary | ICD-10-CM

## 2020-08-22 NOTE — Patient Instructions (Signed)
Apply Neosporin to left 2nd toe once daily for one week. Call if you have any problems.

## 2020-08-30 NOTE — Progress Notes (Signed)
Subjective: Jessica Peterson presents today for follow up of for at risk foot care. Patient has h/o PAD and corn(s) b/l 5th digits , callus(es) b/l hallux and painful mycotic nails.  Pain interferes with ambulation. Aggravating factors include wearing enclosed shoe gear. Painful toenails interfere with ambulation. Aggravating factors include wearing enclosed shoe gear. Pain is relieved with periodic professional debridement. Painful corns and calluses are aggravated when weightbearing with and without shoegear. Pain is relieved with periodic professional debridement.   She voices no new pedal problems on today's visit. Her husband is present on today's visit.  PCP is Dr. Creola Corn and last visit was six months ago.  Allergies  Allergen Reactions  . Penicillins     REACTION: rash Did it involve swelling of the face/tongue/throat, SOB, or low BP? Unknown Did it involve sudden or severe rash/hives, skin peeling, or any reaction on the inside of your mouth or nose? Yes Did you need to seek medical attention at a hospital or doctor's office? No When did it last happen?10 + YEARS If all above answers are "NO", may proceed with cephalosporin use.     Objective: There were no vitals filed for this visit.  Pt 87 y.o. year old AA female  WD, WN in NAD. AAO x 3.   Vascular Examination:  Capillary refill time to digits immediate b/l. Diminished pedal pulses b/l. Pedal hair absent b/l. Skin temperature gradient warm to cool b/l.   Dermatological Examination: Pedal skin is thin shiny, atrophic b/l lower extremities. No open wounds bilaterally. Toenails 1-5 b/l elongated, discolored, dystrophic, thickened, crumbly with subungual debris and tenderness to dorsal palpation. Incurvated nailplate medial border(s) L 2nd toe.  Nail border hypertrophy minimal. There is tenderness to palpation. Sign(s) of infection: no clinical signs of infection noted on examination today. Hyperkeratotic lesion(s) L hallux,  L 5th toe, R hallux and R 5th toe.  No erythema, no edema, no drainage, no fluctuance.  Musculoskeletal: Normal muscle strength 5/5 to all lower extremity muscle groups bilaterally, no pain crepitus or joint limitation noted with ROM b/l, bunion deformity noted b/l and hammertoes noted to the  L 5th toe and R 5th toe  Neurological: Protective sensation intact 5/5 intact bilaterally with 10g monofilament b/l Vibratory sensation intact b/l   Assessment: 1. Pain due to onychomycosis of toenails of both feet   2. Corns and callosities   3. PAD (peripheral artery disease) (HCC)    Plan: -Toenails 1-5 b/l were debrided in length and girth with sterile nail nippers and dremel without iatrogenic bleeding.  -Offending nail border debrided and curretaged L 2nd toe utilizing sterile nail nipper and currette. Border(s) cleansed with alcohol and triple antibiotic ointment applied. Patient instructed to apply Neosporin to L 2nd toe once daily for 7 days. -Corn(s) L 5th toe and R 5th toe pared utilizing sterile scalpel blade without complication or incident. Total number debrided=2. -Callus(es) L hallux and R hallux pared utilizing sterile scalpel blade without complication or incident. Total number debrided =2. -Patient to report any pedal injuries to medical professional immediately.  Return in about 3 months (around 11/22/2020).

## 2020-12-05 ENCOUNTER — Other Ambulatory Visit: Payer: Self-pay

## 2020-12-05 ENCOUNTER — Ambulatory Visit (INDEPENDENT_AMBULATORY_CARE_PROVIDER_SITE_OTHER): Payer: Medicare Other | Admitting: Podiatry

## 2020-12-05 DIAGNOSIS — L84 Corns and callosities: Secondary | ICD-10-CM | POA: Diagnosis not present

## 2020-12-05 DIAGNOSIS — B351 Tinea unguium: Secondary | ICD-10-CM

## 2020-12-05 DIAGNOSIS — M79675 Pain in left toe(s): Secondary | ICD-10-CM | POA: Diagnosis not present

## 2020-12-05 DIAGNOSIS — M79674 Pain in right toe(s): Secondary | ICD-10-CM | POA: Diagnosis not present

## 2020-12-05 DIAGNOSIS — I739 Peripheral vascular disease, unspecified: Secondary | ICD-10-CM | POA: Diagnosis not present

## 2020-12-06 ENCOUNTER — Encounter: Payer: Self-pay | Admitting: Podiatry

## 2020-12-06 NOTE — Progress Notes (Signed)
Subjective:  Patient ID: Jessica Peterson, female    DOB: 06/15/1925,  MRN: [id number]  WADE SIGALA presents to clinic today for at risk foot care. Pt has h/o NIDDM with PAD and corn(s)  and calluses of b/l feet and painful thick toenails that are difficult to trim. Painful toenails interfere with ambulation. Aggravating factors include wearing enclosed shoe gear. Pain is relieved with periodic professional debridement. Painful corns are aggravated when weightbearing when wearing enclosed shoe gear. Pain is relieved with periodic professional debridement.  She relates right great toe and left 5th digit pain. States it feels like her right great toenail is ingrown at medial border. Denies any redness, drainage or swelling. She did file her corn on her left 5th digit and states it's a little sore.  PCP is Creola Corn, MD , and last visit was 2020.  Allergies  Allergen Reactions   Penicillins     REACTION: rash Did it involve swelling of the face/tongue/throat, SOB, or low BP? Unknown Did it involve sudden or severe rash/hives, skin peeling, or any reaction on the inside of your mouth or nose? Yes Did you need to seek medical attention at a hospital or doctor's office? No When did it last happen?      10 + YEARS If all above answers are "NO", may proceed with cephalosporin use.     Review of Systems: Negative except as noted in the HPI. Objective:   Constitutional MYRTH DAHAN is a pleasant 87 y.o. African American female, WD, WN in NAD. AAO x 3.   Vascular Capillary fill time to digits <3 seconds b/l lower extremities. Faintly palpable DP pulse(s) b/l lower extremities. Faintly palpable PT pulse(s) b/l lower extremities. Pedal hair absent. Lower extremity skin temperature gradient within normal limits. No pain with calf compression b/l. No edema noted b/l lower extremities. No cyanosis or clubbing noted.  Neurologic Normal speech. Oriented to person, place, and time. Protective sensation  intact 5/5 intact bilaterally with 10g monofilament b/l. Vibratory sensation intact b/l.  Dermatologic Pedal skin is thin shiny, atrophic b/l lower extremities. Toenails 1-5 b/l elongated, discolored, dystrophic, thickened, crumbly with subungual debris and tenderness to dorsal palpation. Hyperkeratotic lesion(s) L hallux, L 5th toe, R hallux, and R 5th toe.  Left 5th toe with evidence of filing. Center of lesion is red and blanchable. No warmth, no edema.   Orthopedic: Normal muscle strength 5/5 to all lower extremity muscle groups bilaterally. No pain crepitus or joint limitation noted with ROM b/l. Hallux valgus with bunion deformity noted b/l lower extremities. Hammertoe(s) noted to the L 5th toe and R 5th toe.   Radiographs: None Assessment:   1. Pain due to onychomycosis of toenails of both feet   2. Corns and callosities   3. PAD (peripheral artery disease) (HCC)    Plan:  Patient was evaluated and treated and all questions answered.  Onychomycosis with pain -Nails palliatively debridement as below -Educated on self-care  Procedure: Nail Debridement Rationale: Pain Type of Debridement: manual, sharp debridement. Instrumentation: Nail nipper, rotary burr. Number of Nails: 10 -Examined patient. -Patient to continue soft, supportive shoe gear daily. -Toenails 1-5 b/l were debrided in length and girth with sterile nail nippers and dremel without iatrogenic bleeding.  -Corn(s) R 5th toe and callus(es) L hallux and R hallux were pared utilizing sterile scalpel blade without incident. Total number debrided =3. Left 5th toe gently filed with dremel. Applied antibiotic ointment. Instructed her to wear protective band-aid when wearing dress  shoes to church.  -Patient to report any pedal injuries to medical professional immediately. -Patient/POA to call should there be question/concern in the interim.  Return in about 3 months (around 03/07/2021).  Freddie Breech, DPM

## 2021-03-07 NOTE — Telephone Encounter (Signed)
Error

## 2021-03-15 ENCOUNTER — Encounter: Payer: Self-pay | Admitting: Podiatry

## 2021-03-15 ENCOUNTER — Ambulatory Visit (INDEPENDENT_AMBULATORY_CARE_PROVIDER_SITE_OTHER): Payer: Medicare Other | Admitting: Podiatry

## 2021-03-15 ENCOUNTER — Other Ambulatory Visit: Payer: Self-pay

## 2021-03-15 DIAGNOSIS — L84 Corns and callosities: Secondary | ICD-10-CM

## 2021-03-15 DIAGNOSIS — M79674 Pain in right toe(s): Secondary | ICD-10-CM | POA: Diagnosis not present

## 2021-03-15 DIAGNOSIS — I739 Peripheral vascular disease, unspecified: Secondary | ICD-10-CM

## 2021-03-15 DIAGNOSIS — B351 Tinea unguium: Secondary | ICD-10-CM | POA: Diagnosis not present

## 2021-03-15 DIAGNOSIS — M79675 Pain in left toe(s): Secondary | ICD-10-CM | POA: Diagnosis not present

## 2021-03-21 ENCOUNTER — Encounter: Payer: Self-pay | Admitting: Podiatry

## 2021-03-21 NOTE — Progress Notes (Signed)
  Subjective:  Patient ID: Jessica Peterson, female    DOB: 1925-07-04,  MRN: [id number]  87 y.o. female presents with for at risk foot care. Patient has h/o PAD and corn(s) b/l feet , callus(es) b/l feet and painful mycotic nails.  Pain interferes with ambulation. Aggravating factors include wearing enclosed shoe gear. Painful toenails interfere with ambulation. Aggravating factors include wearing enclosed shoe gear. Pain is relieved with periodic professional debridement. Painful corns and calluses are aggravated when weightbearing with and without shoegear. Pain is relieved with periodic professional debridement.  She voices no new pedal problems on today's visit.  PCP: Creola Corn, MD and last visit was: 03/12/2021.  Review of Systems: Negative except as noted in the HPI.   Allergies  Allergen Reactions   Penicillins     REACTION: rash Did it involve swelling of the face/tongue/throat, SOB, or low BP? Unknown Did it involve sudden or severe rash/hives, skin peeling, or any reaction on the inside of your mouth or nose? Yes Did you need to seek medical attention at a hospital or doctor's office? No When did it last happen?      10 + YEARS If all above answers are "NO", may proceed with cephalosporin use.     Objective:  There were no vitals filed for this visit. Constitutional Patient is a pleasant 87 y.o. African American female WD, WN in NAD. AAO x 3.  Vascular Capillary fill time to digits <3 seconds b/l lower extremities. Faintly palpable DP pulse(s) b/l lower extremities. Nonpalpable PT pulse(s) b/l lower extremities. Pedal hair absent. Lower extremity skin temperature gradient within normal limits. No pain with calf compression b/l. No cyanosis or clubbing noted.   Neurologic Normal speech. Protective sensation intact 5/5 intact bilaterally with 10g monofilament b/l. Vibratory sensation intact b/l.  Dermatologic Pedal skin is thin shiny, atrophic b/l lower extremities. No open  wounds b/l lower extremities. No interdigital macerations b/l lower extremities. Toenails 1-5 b/l elongated, discolored, dystrophic, thickened, crumbly with subungual debris and tenderness to dorsal palpation. Hyperkeratotic lesion(s) L hallux, L 5th toe, R hallux, and R 5th toe.  No erythema, no edema, no drainage, no fluctuance.  Orthopedic: Normal muscle strength 5/5 to all lower extremity muscle groups bilaterally. Hammertoe(s) noted to the L 5th toe and R 5th toe.    Assessment:   1. Pain due to onychomycosis of toenails of both feet   2. Corns and callosities   3. PAD (peripheral artery disease) (HCC)    Plan:  Patient was evaluated and treated and all questions answered. Consent given for treatment as described below: -Examined patient. -Patient to continue soft, supportive shoe gear daily. -Toenails 1-5 b/l were debrided in length and girth with sterile nail nippers and dremel without iatrogenic bleeding.  -Corn(s) L 5th toe and R 5th toe and callus(es) L hallux and R hallux were pared utilizing sterile scalpel blade without incident. Total number debrided =4. -Patient to report any pedal injuries to medical professional immediately. -Patient/POA to call should there be question/concern in the interim.  Return in about 3 months (around 06/15/2021).  Freddie Breech, DPM

## 2021-06-21 ENCOUNTER — Encounter: Payer: Self-pay | Admitting: Podiatry

## 2021-06-21 ENCOUNTER — Ambulatory Visit (INDEPENDENT_AMBULATORY_CARE_PROVIDER_SITE_OTHER): Payer: Commercial Managed Care - HMO | Admitting: Podiatry

## 2021-06-21 ENCOUNTER — Other Ambulatory Visit: Payer: Self-pay

## 2021-06-21 DIAGNOSIS — M79675 Pain in left toe(s): Secondary | ICD-10-CM

## 2021-06-21 DIAGNOSIS — M79674 Pain in right toe(s): Secondary | ICD-10-CM

## 2021-06-21 DIAGNOSIS — B351 Tinea unguium: Secondary | ICD-10-CM | POA: Diagnosis not present

## 2021-06-21 DIAGNOSIS — Z9181 History of falling: Secondary | ICD-10-CM | POA: Insufficient documentation

## 2021-06-21 DIAGNOSIS — L84 Corns and callosities: Secondary | ICD-10-CM | POA: Diagnosis not present

## 2021-06-21 DIAGNOSIS — I739 Peripheral vascular disease, unspecified: Secondary | ICD-10-CM | POA: Diagnosis not present

## 2021-06-24 NOTE — Progress Notes (Signed)
°  Subjective:  Patient ID: Jessica Peterson, female    DOB: [birth year],  MRN: [id number]  Jessica Peterson presents to clinic today for for at risk foot care. Patient has h/o PAD and corn(s) bilaterally , callus(es) bilaterally and painful mycotic nails.  Pain interferes with ambulation. Aggravating factors include wearing enclosed shoe gear. Painful toenails interfere with ambulation. Aggravating factors include wearing enclosed shoe gear. Pain is relieved with periodic professional debridement. Painful corns and calluses are aggravated when weightbearing with and without shoegear. Pain is relieved with periodic professional debridement.  Patient relates tenderness to right 5th toe interdigitally. Denies any redness, drainage or swelling. Pain is most symptomatic when wearing enclosed shoe gear.  PCP is Creola Corn, MD , and last visit was 03/12/2021.  Allergies  Allergen Reactions   Penicillins     REACTION: rash Did it involve swelling of the face/tongue/throat, SOB, or low BP? Unknown Did it involve sudden or severe rash/hives, skin peeling, or any reaction on the inside of your mouth or nose? Yes Did you need to seek medical attention at a hospital or doctor's office? No When did it last happen?      10 + YEARS If all above answers are NO, may proceed with cephalosporin use.     Review of Systems: Negative except as noted in the HPI. Objective:   Constitutional Jessica Peterson is a pleasant 88 y.o. African American female, WD, WN in NAD. AAO x 3.   Vascular CFT <3 seconds b/l LE. Faintly palpable DP pulses b/l. Nonpalpable PT pulses b/l. Pedal hair absent b/l. Skin temperature gradient WNL b/l. No pain with calf compression b/l. Trace edema b/l LE. No cyanosis or clubbing noted b/l LE.  Neurologic Normal speech. Oriented to person, place, and time. Protective sensation intact 5/5 intact bilaterally with 10g monofilament b/l. Vibratory sensation intact b/l.  Dermatologic Pedal skin thin,  shiny and atrophic b/l LE. No open wounds b/l LE. No interdigital macerations noted b/l LE. Toenails 1-5 b/l elongated, discolored, dystrophic, thickened, crumbly with subungual debris and tenderness to dorsal palpation. Hyperkeratotic lesion(s) bilateral great toes, bilateral 5th toes, and interdigitally R 4th toe and R 5th toe.  No erythema, no edema, no drainage, no fluctuance.  Orthopedic: Muscle strength 5/5 to all lower extremity muscle groups bilaterally. Hammertoe(s) noted to the bilateral 5th toes.   Radiographs: None Assessment:   1. Pain due to onychomycosis of toenails of both feet   2. Corns and callosities   3. PAD (peripheral artery disease) (HCC)    Plan:  Patient was evaluated and treated and all questions answered. Consent given for treatment as described below: -Mycotic toenails 1-5 bilaterally were debrided in length and girth with sterile nail nippers and dremel without incident. -Corn(s) bilateral 5th toes and R 4th toe and callus(es) bilateral great toes were pared utilizing sterile scalpel blade without incident. Total number debrided =5. -Dispensed foam toe separator. Apply to 4th webspace right foot every morning. Remove every evening. -Patient/POA to call should there be question/concern in the interim.  Return in about 3 months (around 09/19/2021).  Freddie Breech, DPM

## 2021-07-29 ENCOUNTER — Encounter (HOSPITAL_COMMUNITY): Payer: Self-pay

## 2021-07-29 ENCOUNTER — Emergency Department (HOSPITAL_COMMUNITY)
Admission: EM | Admit: 2021-07-29 | Discharge: 2021-07-30 | Disposition: A | Discharge status: Home / Self Care | Payer: Medicare Other | Attending: Emergency Medicine | Admitting: Emergency Medicine

## 2021-07-29 ENCOUNTER — Emergency Department (HOSPITAL_COMMUNITY): Payer: Medicare Other

## 2021-07-29 ENCOUNTER — Other Ambulatory Visit: Payer: Self-pay

## 2021-07-29 DIAGNOSIS — M25559 Pain in unspecified hip: Secondary | ICD-10-CM

## 2021-07-29 DIAGNOSIS — E039 Hypothyroidism, unspecified: Secondary | ICD-10-CM | POA: Insufficient documentation

## 2021-07-29 DIAGNOSIS — M25551 Pain in right hip: Secondary | ICD-10-CM | POA: Insufficient documentation

## 2021-07-29 DIAGNOSIS — I10 Essential (primary) hypertension: Secondary | ICD-10-CM | POA: Diagnosis not present

## 2021-07-29 NOTE — ED Notes (Signed)
Patient transported to X-ray 

## 2021-07-29 NOTE — ED Triage Notes (Signed)
Pt BIB EMS for R hip pain that started Friday with no injury or trauma. Upon EMS arrival pt was hypertensive and tachycardic with a bigeminy rhythm. Pt proceeded to go in and out of bigeminy while with EMS.   VSS w/ EMS. They did note that when BP is being taken when pt is in bigeminy it is more elevated.
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
## 2021-07-30 MED ORDER — LIDOCAINE 5 % EX PTCH: 1.0000 | MEDICATED_PATCH | CUTANEOUS | 0 refills | Status: DC

## 2021-07-30 NOTE — ED Notes (Signed)
Discharge instructions reviewed with patient and family member. Patient and family member verbalized understanding of instructions. Follow-up care and medications were reviewed. Patient ambulatory with steady gait. VSS upon discharge.  °

## 2021-07-30 NOTE — Discharge Instructions (Signed)
You were evaluated in the Emergency Department and after careful evaluation, we did not find any emergent condition requiring admission or further testing in the hospital.  Your exam/testing today was overall reassuring.  X-ray did not show any broken bones or emergencies.  We did find some arthritis in your hip, likely explaining your pain.  Recommend continued use of Tylenol at home.  Recommend follow-up with primary care doctor.  You can also use the numbing patches provided as needed.  Please return to the Emergency Department if you experience any worsening of your condition.  Thank you for allowing [place] to be a part of your care.

## 2021-09-18 ENCOUNTER — Other Ambulatory Visit: Payer: Self-pay | Admitting: Internal Medicine

## 2021-09-18 DIAGNOSIS — E441 Mild protein-calorie malnutrition: Secondary | ICD-10-CM

## 2021-09-20 ENCOUNTER — Other Ambulatory Visit: Payer: Self-pay | Admitting: Internal Medicine

## 2021-09-20 ENCOUNTER — Ambulatory Visit
Admission: RE | Admit: 2021-09-20 | Discharge: 2021-09-20 | Disposition: A | Discharge status: Home / Self Care | Payer: Medicare Other | Source: Ambulatory Visit | Attending: Internal Medicine | Admitting: Internal Medicine

## 2021-09-20 DIAGNOSIS — E441 Mild protein-calorie malnutrition: Secondary | ICD-10-CM

## 2021-09-24 ENCOUNTER — Ambulatory Visit (INDEPENDENT_AMBULATORY_CARE_PROVIDER_SITE_OTHER): Payer: Medicare Other | Admitting: Podiatry

## 2021-09-24 ENCOUNTER — Encounter: Payer: Self-pay | Admitting: Podiatry

## 2021-09-24 DIAGNOSIS — I739 Peripheral vascular disease, unspecified: Secondary | ICD-10-CM | POA: Diagnosis not present

## 2021-09-24 DIAGNOSIS — B351 Tinea unguium: Secondary | ICD-10-CM

## 2021-09-24 DIAGNOSIS — M79675 Pain in left toe(s): Secondary | ICD-10-CM

## 2021-09-24 DIAGNOSIS — L84 Corns and callosities: Secondary | ICD-10-CM | POA: Diagnosis not present

## 2021-09-24 DIAGNOSIS — M79674 Pain in right toe(s): Secondary | ICD-10-CM

## 2021-10-04 NOTE — Progress Notes (Signed)
?  Subjective:  ?Patient ID: Jessica Peterson, female    DOB: 15-Oct-1925,  MRN: [id number] ? ?Jessica Peterson presents to clinic today for for at risk foot care. Patient has h/o PAD and corn(s) b/l lower extremities, callus(es) b/l lower extremities and painful mycotic nails.  Pain interferes with ambulation. Aggravating factors include wearing enclosed shoe gear. Painful toenails interfere with ambulation. Aggravating factors include wearing enclosed shoe gear. Pain is relieved with periodic professional debridement. Painful corns and calluses are aggravated when weightbearing with and without shoegear. Pain is relieved with periodic professional debridement. ? ?New problem(s): None.  ? ?PCP is Creola Corn, MD , and last visit was September 12, 2021. ? ?Allergies  ?Allergen Reactions  ? Penicillins   ?  REACTION: rash ?Did it involve swelling of the face/tongue/throat, SOB, or low BP? Unknown ?Did it involve sudden or severe rash/hives, skin peeling, or any reaction on the inside of your mouth or nose? Yes ?Did you need to seek medical attention at a hospital or doctor's office? No ?When did it last happen?      10 + YEARS ?If all above answers are ?NO?, may proceed with cephalosporin use. ?  ? ? ?Review of Systems: Negative except as noted in the HPI. ? ?Objective: No changes noted in today's physical examination. ? ?Constitutional Jessica Peterson is a pleasant 88 y.o. African American female, WD, WN in NAD. AAO x 3.   ?Vascular CFT <3 seconds b/l LE. Faintly palpable DP pulses b/l. Nonpalpable PT pulses b/l. Pedal hair absent b/l. Skin temperature gradient WNL b/l. No pain with calf compression b/l. Trace edema b/l LE. No cyanosis or clubbing noted b/l LE.  ?Neurologic Normal speech. Oriented to person, place, and time. Protective sensation intact 5/5 intact bilaterally with 10g monofilament b/l. Vibratory sensation intact b/l.  ?Dermatologic Pedal skin thin, shiny and atrophic b/l LE. No open wounds b/l LE. No  interdigital macerations noted b/l LE. Toenails 1-5 b/l elongated, discolored, dystrophic, thickened, crumbly with subungual debris and tenderness to dorsal palpation. Hyperkeratotic lesion(s) bilateral great toes, bilateral 5th toes, and interdigitally R 4th toe and R 5th toe.  No erythema, no edema, no drainage, no fluctuance.  ?Orthopedic: Muscle strength 5/5 to all lower extremity muscle groups bilaterally. Hammertoe(s) noted to the bilateral 5th toes.  ? ?Radiographs: None ? ?Assessment/Plan: ?1. Pain due to onychomycosis of toenails of both feet   ?2. Corns and callosities   ?3. PAD (peripheral artery disease) (HCC)   ?-Patient was evaluated and treated. All patient's and/or POA's questions/concerns answered on today's visit. ?-Toenails 1-5 b/l were debrided in length and girth with sterile nail nippers and dremel without iatrogenic bleeding.  ?-Corn(s) L 5th toe PIPJ, R 4th toe, and dorsal PIPJ R 5th toe, interdigitally R 5th toe and callus(es) bilateral great toes were pared utilizing sterile scalpel blade without incident. Total number debrided =6. ?-Patient/POA to call should there be question/concern in the interim.  ? ?Return in about 10 weeks (around 12/03/2021). ? ?Freddie Breech, DPM  ?

## 2021-12-25 ENCOUNTER — Encounter: Payer: Self-pay | Admitting: Podiatry

## 2021-12-25 ENCOUNTER — Ambulatory Visit (INDEPENDENT_AMBULATORY_CARE_PROVIDER_SITE_OTHER): Payer: Medicare Other | Admitting: Podiatry

## 2021-12-25 DIAGNOSIS — L84 Corns and callosities: Secondary | ICD-10-CM | POA: Diagnosis not present

## 2021-12-25 DIAGNOSIS — M79674 Pain in right toe(s): Secondary | ICD-10-CM | POA: Diagnosis not present

## 2021-12-25 DIAGNOSIS — B351 Tinea unguium: Secondary | ICD-10-CM | POA: Diagnosis not present

## 2021-12-25 DIAGNOSIS — M79675 Pain in left toe(s): Secondary | ICD-10-CM

## 2021-12-25 DIAGNOSIS — I739 Peripheral vascular disease, unspecified: Secondary | ICD-10-CM

## 2021-12-25 NOTE — Patient Instructions (Signed)
Recommend Skechers Loafers with stretchable uppers and memory foam insoles. They can be purchased at [employer], [employer] or [employer]. Also on [email or web address].  

## 2022-01-01 NOTE — Progress Notes (Signed)
  Subjective:  Patient ID: Jessica Peterson, female    DOB: July 04, 1925,  MRN: [id number]  JAMEL DUNTON presents to clinic today for for at risk foot care. Patient has h/o PAD and corn(s) b/l lower extremities, callus(es) b/l lower extremities and painful mycotic nails.  Pain interferes with ambulation. Aggravating factors include wearing enclosed shoe gear. Painful toenails interfere with ambulation. Aggravating factors include wearing enclosed shoe gear. Pain is relieved with periodic professional debridement. Painful corns and calluses are aggravated when weightbearing with and without shoegear. Pain is relieved with periodic professional debridement.  New problem(s): None.   PCP is Creola Corn, MD , and last visit was  April, 2023.  Allergies  Allergen Reactions   Penicillins     REACTION: rash Did it involve swelling of the face/tongue/throat, SOB, or low BP? Unknown Did it involve sudden or severe rash/hives, skin peeling, or any reaction on the inside of your mouth or nose? Yes Did you need to seek medical attention at a hospital or doctor's office? No When did it last happen?      10 + YEARS If all above answers are "NO", may proceed with cephalosporin use.     Review of Systems: Negative except as noted in the HPI.  Objective: No changes noted in today's physical examination. Constitutional ENJOLI TIDD is a pleasant 88 y.o. African American female, WD, WN in NAD. AAO x 3.   Vascular CFT <3 seconds b/l LE. Faintly palpable DP pulses b/l. Nonpalpable PT pulses b/l. Pedal hair absent b/l. Skin temperature gradient WNL b/l. No pain with calf compression b/l. Trace edema b/l LE. No cyanosis or clubbing noted b/l LE.  Neurologic Normal speech. Oriented to person, place, and time. Protective sensation intact 5/5 intact bilaterally with 10g monofilament b/l. Vibratory sensation intact b/l.  Dermatologic Pedal skin thin, shiny and atrophic b/l LE. No open wounds b/l LE. No interdigital  macerations noted b/l LE. Toenails 1-5 b/l elongated, discolored, dystrophic, thickened, crumbly with subungual debris and tenderness to dorsal palpation. Hyperkeratotic lesion(s) plantar IPJ bilateral great toes, bilateral 5th toes, and interdigitally R 4th toe and R 5th toe.  No erythema, no edema, no drainage, no fluctuance.  Orthopedic: Muscle strength 5/5 to all lower extremity muscle groups bilaterally. Hammertoe(s) noted to the bilateral 5th toes.   Radiographs: None  Assessment/Plan: 1. Pain due to onychomycosis of toenails of both feet   2. Corns and callosities   3. PAD (peripheral artery disease) (HCC)      -Examined patient. -No new findings. No new orders. -Patient to continue soft, supportive shoe gear daily. -Mycotic toenails 1-5 bilaterally were debrided in length and girth with sterile nail nippers and dremel without incident. -Corn(s) bilateral 5th toes, medial DIPJ of R 5th toe, and lateral PIPJ of R 4th toe and callus(es) plantar IPJ of left great toe and plantar IPJ of right great toe were pared utilizing sterile scalpel blade without incident. Total number debrided =6. -Recommended Skechers Slip In shoes with stretchable uppers and memory foam insoles. They can be purchased at [employer], [employer] or [employer]. Also on [email or web address].  -Patient/POA to call should there be question/concern in the interim.   Return in about 10 weeks (around 03/05/2022).  Freddie Breech, DPM

## 2022-04-09 ENCOUNTER — Encounter: Payer: Self-pay | Admitting: Podiatry

## 2022-04-09 ENCOUNTER — Ambulatory Visit (INDEPENDENT_AMBULATORY_CARE_PROVIDER_SITE_OTHER): Payer: Medicare Other | Admitting: Podiatry

## 2022-04-09 DIAGNOSIS — M79675 Pain in left toe(s): Secondary | ICD-10-CM

## 2022-04-09 DIAGNOSIS — B351 Tinea unguium: Secondary | ICD-10-CM | POA: Diagnosis not present

## 2022-04-09 DIAGNOSIS — I739 Peripheral vascular disease, unspecified: Secondary | ICD-10-CM

## 2022-04-09 DIAGNOSIS — L84 Corns and callosities: Secondary | ICD-10-CM

## 2022-04-09 DIAGNOSIS — M79674 Pain in right toe(s): Secondary | ICD-10-CM | POA: Diagnosis not present

## 2022-04-09 NOTE — Progress Notes (Signed)
  Subjective:  Patient ID: Jessica Peterson, female    DOB: [birth year],  MRN: [id number]  Jessica Peterson presents to clinic today for for at risk foot care. Patient has h/o PAD and corn(s) b/l lower extremities, callus(es) b/l lower extremities and painful mycotic nails.  Pain interferes with ambulation. Aggravating factors include wearing enclosed shoe gear. Painful toenails interfere with ambulation. Aggravating factors include wearing enclosed shoe gear. Pain is relieved with periodic professional debridement. Painful corns and calluses are aggravated when weightbearing with and without shoegear. Pain is relieved with periodic professional debridement.. Chief Complaint  Patient presents with   Nail Problem    Routine foot care PCP- Virgina Jock PCP VST-03/31/2022   New problem(s): None.   PCP is Shon Baton, MD , and last visit was April 08, 2022.  Allergies  Allergen Reactions   Penicillins     REACTION: rash Did it involve swelling of the face/tongue/throat, SOB, or low BP? Unknown Did it involve sudden or severe rash/hives, skin peeling, or any reaction on the inside of your mouth or nose? Yes Did you need to seek medical attention at a hospital or doctor's office? No When did it last happen?      10 + YEARS If all above answers are "NO", may proceed with cephalosporin use.    Review of Systems: Negative except as noted in the HPI.  Objective: No changes noted in today's physical examination.  Jessica Peterson is a pleasant 88 y.o. female WD, WN in NAD. AAO x 3. Vascular CFT <3 seconds b/l LE. Faintly palpable DP pulses b/l. Nonpalpable PT pulses b/l. Pedal hair absent b/l. Skin temperature gradient WNL b/l. No pain with calf compression b/l. Trace edema b/l LE. No cyanosis or clubbing noted b/l LE.  Neurologic Normal speech. Oriented to person, place, and time. Protective sensation intact 5/5 intact bilaterally with 10g monofilament b/l. Vibratory sensation intact b/l.  Dermatologic  Pedal skin thin, shiny and atrophic b/l LE. No open wounds b/l LE. No interdigital macerations noted b/l LE.   Toenails 1-5 b/l elongated, discolored, dystrophic, thickened, crumbly with subungual debris and tenderness to dorsal palpation.   Hyperkeratotic lesion(s) plantar IPJ bilateral great toes, bilateral 5th toes, and interdigitally R 4th toe and R 5th toe.  No erythema, no edema, no drainage, no fluctuance.  Orthopedic: Muscle strength 5/5 to all lower extremity muscle groups bilaterally. Hammertoe(s) noted to the bilateral 5th toes.   Radiographs: None Assessment/Plan: 1. Pain due to onychomycosis of toenails of both feet   2. Corns and callosities   3. PAD (peripheral artery disease) ([place])     No orders of the defined types were placed in this encounter.   -Patient was evaluated and treated. All patient's and/or POA's questions/concerns answered on today's visit. -Continue supportive shoe gear daily. -Toenails 1-5 b/l were debrided in length and girth with sterile nail nippers and dremel without iatrogenic bleeding.  -Corn(s) bilateral 5th toes and callus(es) plantar IPJ of left great toe and plantar IPJ of right great toe were pared utilizing sterile scalpel blade without incident. Total number debrided =4. -Patient/POA to call should there be question/concern in the interim.   Return in about 3 months (around 07/10/2022).  Marzetta Board, DPM

## 2022-08-05 ENCOUNTER — Ambulatory Visit (INDEPENDENT_AMBULATORY_CARE_PROVIDER_SITE_OTHER): Payer: 59 | Admitting: Podiatry

## 2022-08-05 VITALS — BP 181/77

## 2022-08-05 DIAGNOSIS — M79675 Pain in left toe(s): Secondary | ICD-10-CM | POA: Diagnosis not present

## 2022-08-05 DIAGNOSIS — L84 Corns and callosities: Secondary | ICD-10-CM

## 2022-08-05 DIAGNOSIS — M79674 Pain in right toe(s): Secondary | ICD-10-CM

## 2022-08-05 DIAGNOSIS — B351 Tinea unguium: Secondary | ICD-10-CM

## 2022-08-05 DIAGNOSIS — I739 Peripheral vascular disease, unspecified: Secondary | ICD-10-CM | POA: Diagnosis not present

## 2022-08-05 NOTE — Progress Notes (Unsigned)
  Subjective:  Patient ID: Jessica Peterson, female    DOB: Nov 11, 1925,  MRN: [id number]  TRIANNA Peterson presents to clinic today for {jgcomplaint:23593}  Chief Complaint  Patient presents with   Nail Problem    [facility] PCP-[facility] PCP VST-03/2022   New problem(s): None. {jgcomplaint:23593}  PCP is Shon Baton, MD.  Allergies  Allergen Reactions   Penicillins     REACTION: rash Did it involve swelling of the face/tongue/throat, SOB, or low BP? Unknown Did it involve sudden or severe rash/hives, skin peeling, or any reaction on the inside of your mouth or nose? Yes Did you need to seek medical attention at a hospital or doctor's office? No When did it last happen?      10 + YEARS If all above answers are "NO", may proceed with cephalosporin use.     Review of Systems: Negative except as noted in the HPI.  Objective: No changes noted in today's physical examination. There were no vitals filed for this visit. Jessica Peterson is a pleasant 89 y.o. female WD, WN in NAD. AAO x 3.  Vascular CFT <3 seconds b/l LE. Faintly palpable DP pulses b/l. Nonpalpable PT pulses b/l. Pedal hair absent b/l. Skin temperature gradient WNL b/l. No pain with calf compression b/l. Trace edema b/l LE. No cyanosis or clubbing noted b/l LE.  Neurologic Normal speech. Oriented to person, place, and time. Protective sensation intact 5/5 intact bilaterally with 10g monofilament b/l. Vibratory sensation intact b/l.  Dermatologic Pedal skin thin, shiny and atrophic b/l LE. No open wounds b/l LE. No interdigital macerations noted b/l LE.   Toenails 1-5 b/l elongated, discolored, dystrophic, thickened, crumbly with subungual debris and tenderness to dorsal palpation.   Hyperkeratotic lesion(s) plantar IPJ bilateral great toes, bilateral 5th toes, and interdigitally R 4th toe and R 5th toe.  No erythema, no edema, no drainage, no fluctuance.  Orthopedic: Muscle strength 5/5 to all lower extremity muscle groups  bilaterally. Hammertoe(s) noted to the bilateral 5th toes.   Radiographs: None  Assessment/Plan: 1. Pain due to onychomycosis of toenails of both feet   2. Corns and callosities   3. PAD (peripheral artery disease) ([place])     No orders of the defined types were placed in this encounter.   None {Jgplan:23602::"-Patient/POA to call should there be question/concern in the interim."}   Return in about 3 months (around 11/03/2022).  Marzetta Board, DPM

## 2022-08-07 ENCOUNTER — Encounter: Payer: Self-pay | Admitting: Podiatry

## 2022-12-03 ENCOUNTER — Encounter: Payer: Self-pay | Admitting: Podiatry

## 2022-12-03 ENCOUNTER — Ambulatory Visit (INDEPENDENT_AMBULATORY_CARE_PROVIDER_SITE_OTHER): Payer: 59 | Admitting: Podiatry

## 2022-12-03 VITALS — BP 177/63

## 2022-12-03 DIAGNOSIS — B351 Tinea unguium: Secondary | ICD-10-CM

## 2022-12-03 DIAGNOSIS — M79675 Pain in left toe(s): Secondary | ICD-10-CM | POA: Diagnosis not present

## 2022-12-03 DIAGNOSIS — L84 Corns and callosities: Secondary | ICD-10-CM | POA: Diagnosis not present

## 2022-12-03 DIAGNOSIS — M79674 Pain in right toe(s): Secondary | ICD-10-CM

## 2022-12-03 DIAGNOSIS — I739 Peripheral vascular disease, unspecified: Secondary | ICD-10-CM

## 2022-12-07 NOTE — Progress Notes (Signed)
Subjective:  Patient ID: Jessica Peterson, female    DOB: 1925/11/17,  MRN: [id number]  Jessica Peterson presents to clinic today for at risk foot care. Patient has h/o PAD and corn(s) both feet, callus(es) both feet and painful mycotic nails.  Pain interferes with ambulation. Aggravating factors include wearing enclosed shoe gear. Painful toenails interfere with ambulation. Aggravating factors include wearing enclosed shoe gear. Pain is relieved with periodic professional debridement. Painful corns and calluses are aggravated when weightbearing with and without shoegear. Pain is relieved with periodic professional debridement.  Chief Complaint  Patient presents with   Nail Problem    Rfc,Referring Provider Creola Corn, MD,lov:06/24      New problem(s): None.   PCP is Creola Corn, MD.  Allergies  Allergen Reactions   Penicillins Other (See Comments)    REACTION: rash  Did it involve swelling of the face/tongue/throat, SOB, or low BP? Unknown  Did it involve sudden or severe rash/hives, skin peeling, or any reaction on the inside of your mouth or nose? Yes  Did you need to seek medical attention at a hospital or doctor's office? No  When did it last happen?      10 + YEARS  If all above answers are "NO", may proceed with cephalosporin use.  REACTION: rash, Did it involve swelling of the face/tongue/throat, SOB, or low BP? Unknown, Did it involve sudden or severe rash/hives, skin peeling, or any reaction on the inside of your mouth or nose? Yes, Did you need to seek medical attention at a hospital or doctor's office? No, When did it last happen?      10 + YEARS, If all above answers are "NO", may proceed with cephalosporin use.    Review of Systems: Negative except as noted in the HPI.  Objective: No changes noted in today's physical examination. Vitals:   12/03/22 1529 12/03/22 1530  BP: (!) 173/69 (!) 177/63   Jessica Peterson is a pleasant 89 y.o. female WD, WN in NAD. AAO x  3. Vascular Examination: CFT <3 seconds b/l. DP pulses faintly palpable b/l. PT pulses nonpalpable b/l. Digital hair absent. Skin temperature gradient warm to warm b/l. No pain with calf compression. No ischemia or gangrene. No cyanosis or clubbing noted b/l.    Neurological Examination: Sensation grossly intact b/l with 10 gram monofilament. Vibratory sensation intact b/l.   Dermatological Examination: Pedal skin warm and supple b/l. No open wounds b/l. No interdigital macerations. Toenails 1-5 b/l thick, discolored, elongated with subungual debris and pain on dorsal palpation.  Hyperkeratotic lesion(s) bilateral great toes, bilateral 5th toes, and R 4th toe.  No erythema, no edema, no drainage, no fluctuance.  Musculoskeletal Examination: Normal muscle strength 5/5 to all lower extremity muscle groups bilaterally. Hammertoe(s) noted to the L 5th toe and R 5th toe.Marland Kitchen No pain, crepitus or joint limitation noted with ROM b/l LE.  Patient ambulates independently without assistive aids.  Radiographs: None  Assessment/Plan: 1. Pain due to onychomycosis of toenails of both feet   2. Corns and callosities   3. PAD (peripheral artery disease) (HCC)    -Patient was evaluated and treated. All patient's and/or POA's questions/concerns answered on today's visit. -Continue supportive shoe gear daily. -Toenails 1-5 b/l were debrided in length and girth with sterile nail nippers and dremel without iatrogenic bleeding.  -Corn(s) bilateral 5th toes pared utilizing sharp debridement with sterile blade without complication or incident. Total number debrided=2. -Callus(es) bilateral great toes pared utilizing sterile scalpel blade without complication  or incident. Total number debrided =2. -Patient/POA to call should there be question/concern in the interim.   Return in about 3 months (around 03/05/2023).  Freddie Breech, DPM

## 2023-03-11 ENCOUNTER — Ambulatory Visit: Payer: 59 | Admitting: Podiatry

## 2023-05-09 ENCOUNTER — Other Ambulatory Visit: Payer: Self-pay

## 2023-05-09 ENCOUNTER — Emergency Department (HOSPITAL_BASED_OUTPATIENT_CLINIC_OR_DEPARTMENT_OTHER)
Admission: EM | Admit: 2023-05-09 | Discharge: 2023-05-10 | Disposition: A | Discharge status: Home / Self Care | Payer: 59 | Attending: Emergency Medicine | Admitting: Emergency Medicine

## 2023-05-09 DIAGNOSIS — Z7982 Long term (current) use of aspirin: Secondary | ICD-10-CM | POA: Insufficient documentation

## 2023-05-09 DIAGNOSIS — R000 Tachycardia, unspecified: Secondary | ICD-10-CM | POA: Insufficient documentation

## 2023-05-09 DIAGNOSIS — N3000 Acute cystitis without hematuria: Secondary | ICD-10-CM | POA: Insufficient documentation

## 2023-05-09 DIAGNOSIS — R1030 Lower abdominal pain, unspecified: Secondary | ICD-10-CM | POA: Diagnosis present

## 2023-05-09 DIAGNOSIS — K5900 Constipation, unspecified: Secondary | ICD-10-CM | POA: Diagnosis not present

## 2023-05-09 NOTE — ED Triage Notes (Signed)
Pt to triage c/o constipation x 2 days. Pt reports taking Murelax and trying to disimpact herself with no success. Pt present a/o x 4 skin warm to touch. Hr 139 EKG completed and to provider. Pt denies CP SOB.

## 2023-05-10 ENCOUNTER — Emergency Department (HOSPITAL_BASED_OUTPATIENT_CLINIC_OR_DEPARTMENT_OTHER): Payer: 59

## 2023-05-10 DIAGNOSIS — N3000 Acute cystitis without hematuria: Secondary | ICD-10-CM | POA: Diagnosis not present

## 2023-05-10 LAB — COMPREHENSIVE METABOLIC PANEL
ALT: 10 U/L (ref 0–44)
AST: 22 U/L (ref 15–41)
Albumin: 4.1 g/dL (ref 3.5–5.0)
Alkaline Phosphatase: 82 U/L (ref 38–126)
Anion gap: 10 (ref 5–15)
BUN: 12 mg/dL (ref 8–23)
CO2: 22 mmol/L (ref 22–32)
Calcium: 9.4 mg/dL (ref 8.9–10.3)
Chloride: 98 mmol/L (ref 98–111)
Creatinine, Ser: 0.82 mg/dL (ref 0.44–1.00)
GFR, Estimated: >60 mL/min (ref >60)
Glucose, Bld: 123 mg/dL — ABNORMAL HIGH (ref 70–99)
Potassium: 3.8 mmol/L (ref 3.5–5.1)
Sodium: 130 mmol/L — ABNORMAL LOW (ref 135–145)
Total Bilirubin: 0.4 mg/dL (ref <1.2)
Total Protein: 6.9 g/dL (ref 6.5–8.1)

## 2023-05-10 LAB — URINALYSIS, ROUTINE W REFLEX MICROSCOPIC
Bilirubin Urine: NEGATIVE
Glucose, UA: NEGATIVE mg/dL
Nitrite: NEGATIVE
Protein, ur: NEGATIVE mg/dL
Specific Gravity, Urine: 1.012 (ref 1.005–1.030)
pH: 7 (ref 5.0–8.0)

## 2023-05-10 LAB — CBC WITH DIFFERENTIAL/PLATELET
Abs Immature Granulocytes: 0.02 10*3/uL (ref 0.00–0.07)
Basophils Absolute: 0 10*3/uL (ref 0.0–0.1)
Basophils Relative: 0 %
Eosinophils Absolute: 0 10*3/uL (ref 0.0–0.5)
Eosinophils Relative: 0 %
HCT: 36.7 % (ref 36.0–46.0)
Hemoglobin: 12.5 g/dL (ref 12.0–15.0)
Immature Granulocytes: 0 %
Lymphocytes Relative: 7 %
Lymphs Abs: 0.8 10*3/uL (ref 0.7–4.0)
MCH: 28.3 pg (ref 26.0–34.0)
MCHC: 34.1 g/dL (ref 30.0–36.0)
MCV: 83.2 fL (ref 80.0–100.0)
Monocytes Absolute: 0.5 10*3/uL (ref 0.1–1.0)
Monocytes Relative: 4 %
Neutro Abs: 10.2 10*3/uL — ABNORMAL HIGH (ref 1.7–7.7)
Neutrophils Relative %: 89 %
Platelets: 272 10*3/uL (ref 150–400)
RBC: 4.41 MIL/uL (ref 3.87–5.11)
RDW: 14.5 % (ref 11.5–15.5)
WBC: 11.5 10*3/uL — ABNORMAL HIGH (ref 4.0–10.5)
nRBC: 0 % (ref 0.0–0.2)

## 2023-05-10 LAB — LIPASE, BLOOD: Lipase: <10 U/L — ABNORMAL LOW (ref 11–51)

## 2023-05-10 LAB — TSH: TSH: 1.695 u[IU]/mL (ref 0.350–4.500)

## 2023-05-10 LAB — T4, FREE: Free T4: 1.19 ng/dL — ABNORMAL HIGH (ref 0.61–1.12)

## 2023-05-10 MED ORDER — POLYETHYLENE GLYCOL 3350 17 G PO PACK: 17.0000 g | PACK | Freq: Every day | ORAL | 0 refills | Status: AC

## 2023-05-10 MED ORDER — DOCUSATE SODIUM 100 MG PO CAPS: 100.0000 mg | ORAL_CAPSULE | Freq: Two times a day (BID) | ORAL | 0 refills | Status: AC

## 2023-05-10 MED ORDER — CEPHALEXIN 500 MG PO CAPS: 500.0000 mg | ORAL_CAPSULE | Freq: Four times a day (QID) | ORAL | 0 refills | Status: AC

## 2023-05-10 MED ORDER — IOHEXOL 300 MG/ML  SOLN
100.0000 mL | Freq: Once | INTRAMUSCULAR | Status: AC | PRN
  Administered 2023-05-10: 100 mL via INTRAVENOUS

## 2023-05-10 MED ORDER — LACTATED RINGERS IV BOLUS
1000.0000 mL | Freq: Once | INTRAVENOUS | Status: AC
Start: 2023-05-10 — End: 2023-05-10
  Administered 2023-05-10: 1000 mL via INTRAVENOUS

## 2023-05-10 MED ORDER — SODIUM CHLORIDE 0.9 % IV SOLN
2.0000 g | Freq: Once | INTRAVENOUS | Status: AC
  Administered 2023-05-10: 2 g via INTRAVENOUS
  Filled 2023-05-10: qty 20

## 2023-05-10 MED ORDER — MAGNESIUM CITRATE PO SOLN: 1.0000 | Freq: Once | ORAL | 3 refills | Status: AC

## 2023-05-10 MED ORDER — FLEET ENEMA RE ENEM: 1.0000 | ENEMA | Freq: Every day | RECTAL | 1 refills | Status: AC | PRN

## 2023-05-10 MED ORDER — MINERAL OIL RE ENEM
1.0000 | ENEMA | Freq: Once | RECTAL | Status: AC
  Administered 2023-05-10: 1 via RECTAL
  Filled 2023-05-10: qty 1

## 2023-05-10 NOTE — ED Provider Notes (Signed)
Jessica Peterson EMERGENCY DEPARTMENT AT [facility] Provider Note   CSN: [id number] Arrival date & time: 05/09/23  2257     History  Chief Complaint  Patient presents with   Constipation    Jessica Peterson is a 89 y.o. female.  89 year old female who presents ER today secondary to constipation and lower abdominal pressure.  Patient states that she normally has bowel movements every day or 2.  She has not had 1 at least 3 or 4 days.  She states she is able to pass small amounts a couple times throughout the day.  She tried MiraLAX without relief.  She has some abdominal pressure but suprapubic in nature that seems to be intermittent and better when she presses on it.  No fevers.  States normal food intake.  She is lost 30-35 pounds in the last year or 2 secondary to being edentulous is what she thinks.  No thyroids been checked regularly.  She only takes losartan and Synthroid daily and then twice a week will take hydrochlorothiazide.  She does take some eyedrops she is not sure which ones.  No other real medical complaints.  No other real illnesses.  She had a colonoscopy 15 years ago that was normal and has never had an abnormal 1.  No history of cancer.   Constipation      Home Medications Prior to Admission medications   Medication Sig Start Date End Date Taking? Authorizing Provider  cephALEXin (KEFLEX) 500 MG capsule Take 1 capsule (500 mg total) by mouth 4 (four) times daily. 05/10/23  Yes Carleen Rhue, Barbara Cower, MD  docusate sodium (COLACE) 100 MG capsule Take 1 capsule (100 mg total) by mouth every 12 (twelve) hours. 05/10/23  Yes Rafi Kenneth, Barbara Cower, MD  magnesium citrate SOLN Take 296 mLs (1 Bottle total) by mouth once for 1 dose. 05/10/23 05/10/23 Yes Ronnesha Mester, Barbara Cower, MD  polyethylene glycol (MIRALAX / GLYCOLAX) 17 g packet Take 17 g by mouth daily. 05/10/23  Yes Radhika Dershem, Barbara Cower, MD  sodium phosphate (FLEET) ENEM Place 133 mLs (1 enema total) rectally daily as needed for severe constipation.  05/10/23  Yes Abrish Erny, Barbara Cower, MD  aspirin EC 81 MG tablet Take 81 mg by mouth daily.    [provider]  chlorhexidine (PERIDEX) 0.12 % solution SWISH AND SPIT 15 ML TWICE A DAY 01/12/19   [provider]  cholecalciferol (VITAMIN D3) 25 MCG (1000 UT) tablet Take 1,000 Units by mouth daily.    [provider]  hydrochlorothiazide (MICROZIDE) 12.5 MG capsule Take 12.5 mg by mouth daily.  06/04/18   [provider]  ibuprofen (ADVIL) 800 MG tablet Take 800 mg by mouth every 8 (eight) hours as needed. 08/18/19   [provider]  ketorolac (ACULAR) 0.5 % ophthalmic solution Place 1 drop into the right eye 2 (two) times daily.  06/29/18   [provider]  levothyroxine (SYNTHROID) 100 MCG tablet Take 100 mcg by mouth daily. 12/20/20   [provider]  losartan (COZAAR) 100 MG tablet Take 100 mg by mouth daily.  06/04/18   [provider]  prednisoLONE acetate (PRED FORTE) 1 % ophthalmic suspension Place 1 drop into the right eye 2 (two) times daily.  06/29/18   [provider]  RESTASIS 0.05 % ophthalmic emulsion 1 drop 2 (two) times daily. 12/05/21   [provider]  vitamin B-12 (CYANOCOBALAMIN) 1000 MCG tablet Take 1,000 mcg by mouth daily.    [provider]      Allergies  Penicillins    Review of Systems   Review of Systems  Gastrointestinal:  Positive for constipation.    Physical Exam Updated Vital Signs BP (!) 170/95   Pulse 82   Temp 98.9 F (37.2 C) (Oral)   Resp 16   Wt 61.3 kg   SpO2 99%   BMI 24.72 kg/m  Physical Exam Vitals and nursing note reviewed.  Constitutional:      Appearance: She is well-developed.  HENT:     Head: Normocephalic and atraumatic.     Mouth/Throat:     Mouth: Mucous membranes are dry.  Cardiovascular:     Rate and Rhythm: Regular rhythm. Tachycardia present.  Pulmonary:     Effort: No respiratory distress.     Breath sounds: No stridor.  Abdominal:      General: There is no distension.     Tenderness: There is no abdominal tenderness.  Musculoskeletal:        General: No swelling or tenderness. Normal range of motion.     Cervical back: Normal range of motion.  Skin:    General: Skin is warm and dry.  Neurological:     General: No focal deficit present.     Mental Status: She is alert.     ED Results / Procedures / Treatments   Labs (all labs ordered are listed, but only abnormal results are displayed) Labs Reviewed  CBC WITH DIFFERENTIAL/PLATELET - Abnormal; Notable for the following components:      Result Value   WBC 11.5 (*)    Neutro Abs 10.2 (*)    All other components within normal limits  COMPREHENSIVE METABOLIC PANEL - Abnormal; Notable for the following components:   Sodium 130 (*)    Glucose, Bld 123 (*)    All other components within normal limits  LIPASE, BLOOD - Abnormal; Notable for the following components:   Lipase <10 (*)    All other components within normal limits  URINALYSIS, ROUTINE W REFLEX MICROSCOPIC - Abnormal; Notable for the following components:   APPearance CLOUDY (*)    Hgb urine dipstick MODERATE (*)    Ketones, ur TRACE (*)    Leukocytes,Ua MODERATE (*)    Bacteria, UA MANY (*)    All other components within normal limits  URINE CULTURE  TSH  T4, FREE    EKG None  Radiology CT ABDOMEN PELVIS W CONTRAST  Result Date: 05/10/2023 CLINICAL DATA:  Left lower quadrant abdominal pain, right lower quadrant abdominal pain, and constipation for 2 days. EXAM: CT ABDOMEN AND PELVIS WITH CONTRAST TECHNIQUE: Multidetector CT imaging of the abdomen and pelvis was performed using the standard protocol following bolus administration of intravenous contrast. RADIATION DOSE REDUCTION: This exam was performed according to the departmental dose-optimization program which includes automated exposure control, adjustment of the mA and/or kV according to patient size and/or use of iterative reconstruction  technique. CONTRAST:  OMNIPAQUE IOHEXOL 300 MG/ML  SOLN COMPARISON:  01/04/2010 FINDINGS: Lower chest: Slight interstitial fibrosis in the lung bases. No consolidation or airspace disease. Hepatobiliary: No focal liver abnormality is seen. Status post cholecystectomy. No biliary dilatation. Pancreas: Fatty atrophy of the pancreas. No acute inflammatory changes or focal lesions identified. Spleen: Normal in size without focal abnormality. Adrenals/Urinary Tract: Adrenal glands are unremarkable. Kidneys are normal, without renal calculi, focal lesion, or hydronephrosis. Bladder is unremarkable. Stomach/Bowel: Stomach, small bowel, and colon are not abnormally distended. Stool diffusely fills the colon with prominent rectal stool. No wall thickening or inflammatory changes.  Appendix is normal. Vascular/Lymphatic: Aortic atherosclerosis. No enlarged abdominal or pelvic lymph nodes. Reproductive: Calcifications in the uterus consistent with fibroids. No abnormal adnexal masses. Other: No abdominal wall hernia or abnormality. No abdominopelvic ascites. Musculoskeletal: Degenerative changes in the spine. IMPRESSION: 1. Diffusely stool-filled colon. No small or large bowel distention. 2. Calcified fibroids in the uterus. 3. Aortic atherosclerosis. Electronically Signed   By: Burman Nieves M.D.   On: 05/10/2023 02:01    Procedures .Fecal disimpaction  Date/Time: 05/10/2023 4:13 AM  Performed by: Marily Memos, MD Authorized by: Marily Memos, MD  Consent: Verbal consent obtained. Consent given by: patient and power of attorney Patient understanding: patient states understanding of the procedure being performed Patient identity confirmed: verbally with patient Time out: Immediately prior to procedure a "time out" was called to verify the correct patient, procedure, equipment, support staff and site/side marked as required. Preparation: Patient was prepped and draped in the usual sterile fashion. Local  anesthesia used: no  Anesthesia: Local anesthesia used: no  Sedation: Patient sedated: no  Patient tolerance: patient tolerated the procedure well with no immediate complications Comments: Small amount of moderate consistency stool disimpacted.  No significant amount of hard stool.  Chaperoned by respiratory therapist-Wendy.       Medications Ordered in ED Medications  lactated ringers bolus 1,000 mL (0 mLs Intravenous Stopped 05/10/23 0139)  iohexol (OMNIPAQUE) 300 MG/ML solution 100 mL (100 mLs Intravenous Contrast Given 05/10/23 0125)  mineral oil enema 1 enema (1 enema Rectal Given 05/10/23 0405)  cefTRIAXone (ROCEPHIN) 2 g in sodium chloride 0.9 % 100 mL IVPB (0 g Intravenous Stopped 05/10/23 0558)    ED Course/ Medical Decision Making/ A&P                                 Medical Decision Making Amount and/or Complexity of Data Reviewed Labs: ordered. Radiology: ordered.  Risk OTC drugs. Prescription drug management.   Patient with UTI.  This may be reactive to the constipation and large amount of stool in her colon versus always present.  Will go ahead and treat and send a culture. Disimpacted a small amount of stool but the stool was soft and relatively proximal, we will try an enema.  She is her been doing some MiraLAX at home may increase that regimen as well.  Check thyroid with her initial tachycardia to make sure she was not hyperthyroid since she also had tacky mouth and high blood pressure and recent weight loss but this has been normal.  It was also normal back in October.  Electrolytes are reassuring.  Hemoglobin is reassuring.  No obvious cause for the constipation so I suspect possible dehydration.  Some fluids were given her heart rate, tacky mouth and sunken eyes at all improved.  CT scan done to evaluate for any mechanical obstruction versus bowel obstruction or other etiology and this was reassuring.  Will plan for an enema here continue MiraLAX and enemas at  home and GI follow-up if not improving. Patient with small bowel movement after enema. Had mineral here, will do a fleets later in the day and enact the miralax plan w/ fiber, fluids/electrolytes and increased activity.  I discussed the risk of electrolyte abnormalities with fleets and MiraLAX and the need to drink quite a bit of extra water related to the osmotic nature of the MiraLAX and electrolytes to avoid depletion with these medicines.  Patient and family understand.  They will follow with her PCP if not improving in a few days otherwise return here for any new or worsening symptoms.   Final Clinical Impression(s) / ED Diagnoses Final diagnoses:  Constipation, unspecified constipation type  Acute cystitis without hematuria    Rx / DC Orders ED Discharge Orders          Ordered    cephALEXin (KEFLEX) 500 MG capsule  4 times daily        05/10/23 0555    polyethylene glycol (MIRALAX / GLYCOLAX) 17 g packet  Daily        05/10/23 0555    docusate sodium (COLACE) 100 MG capsule  Every 12 hours        05/10/23 0555    sodium phosphate (FLEET) ENEM  Daily PRN        05/10/23 0555    magnesium citrate SOLN   Once        05/10/23 0555              Larrell Rapozo, Barbara Cower, MD 05/10/23 [phone]

## 2023-05-10 NOTE — ED Notes (Signed)
Pt ambulated to restroom. 

## 2023-05-10 NOTE — Discharge Instructions (Signed)
There are multiple ways to improve your constipation, some options are listed below and all involve over the counter medications:   * You can try the magnesium citrate 1 bottle per day until you get results. * You can try an enema once daily as needed.  * Generally, you need to increase your fluid intake and take stool softeners twice a day until you follow-up with your primary doctor for further management. * MiraLAX is an osmotic laxative. This means that it will keep electrolytes and water in your stool and allow you to have easier bowel movements.        * One option is to take 3 capfuls of MiraLAX and put in a 32 ounce Gatorade and shake it up.  Drink 4-6 ounces per hour until you have results.  Another way is per the directions below.       * You  can take this medication up to 3 times daily as needed produce bowel movements. However while you're taking this much MiraLAX you need to make sure you are replenishing her electrolytes and water loss. He should also not take it more than 2 days in a row at this level. Generally I recommend people workup to this. For example take 1 capful of MiraLAX once a day for 2-3 days and if you do not get improvement in your bowel habits then take 1 capful twice a day for 2-3 days and if they don't get relief from this and you can take 1 capful 3 times a day for 2 days. If you start having diarrhea, decrease the amount of MiraLAX you are using until you have soft formed stools once to twice a day. Remember during this process that you need to increase your electrolytes and water intake, so oftentimes sports drinks are good for this. Some people end up taking half or one whole capful daily for the rest of her lives to have regular bowel movements you can do this as you feel is proper.

## 2023-05-10 NOTE — ED Notes (Signed)
UA provided. Small BM. Ambulates without assist.

## 2023-05-10 NOTE — ED Notes (Signed)
Lab called and they will add on a urine culture. 

## 2023-05-11 LAB — URINE CULTURE

## 2023-05-31 ENCOUNTER — Emergency Department (HOSPITAL_BASED_OUTPATIENT_CLINIC_OR_DEPARTMENT_OTHER): Payer: 59

## 2023-05-31 ENCOUNTER — Encounter (HOSPITAL_BASED_OUTPATIENT_CLINIC_OR_DEPARTMENT_OTHER): Payer: Self-pay

## 2023-05-31 ENCOUNTER — Ambulatory Visit
Admission: EM | Admit: 2023-05-31 | Discharge: 2023-05-31 | Disposition: A | Discharge status: Home / Self Care | Payer: 59 | Attending: Family Medicine | Admitting: Family Medicine

## 2023-05-31 ENCOUNTER — Emergency Department (HOSPITAL_BASED_OUTPATIENT_CLINIC_OR_DEPARTMENT_OTHER)
Admission: EM | Admit: 2023-05-31 | Discharge: 2023-05-31 | Disposition: A | Discharge status: Home / Self Care | Payer: 59 | Attending: Emergency Medicine | Admitting: Emergency Medicine

## 2023-05-31 ENCOUNTER — Other Ambulatory Visit: Payer: Self-pay

## 2023-05-31 DIAGNOSIS — K130 Diseases of lips: Secondary | ICD-10-CM | POA: Diagnosis not present

## 2023-05-31 DIAGNOSIS — I10 Essential (primary) hypertension: Secondary | ICD-10-CM | POA: Insufficient documentation

## 2023-05-31 DIAGNOSIS — W010XXA Fall on same level from slipping, tripping and stumbling without subsequent striking against object, initial encounter: Secondary | ICD-10-CM | POA: Diagnosis not present

## 2023-05-31 DIAGNOSIS — S01511A Laceration without foreign body of lip, initial encounter: Secondary | ICD-10-CM | POA: Diagnosis not present

## 2023-05-31 DIAGNOSIS — M79605 Pain in left leg: Secondary | ICD-10-CM | POA: Diagnosis not present

## 2023-05-31 DIAGNOSIS — Z79899 Other long term (current) drug therapy: Secondary | ICD-10-CM | POA: Diagnosis not present

## 2023-05-31 DIAGNOSIS — Z7982 Long term (current) use of aspirin: Secondary | ICD-10-CM | POA: Insufficient documentation

## 2023-05-31 DIAGNOSIS — E039 Hypothyroidism, unspecified: Secondary | ICD-10-CM | POA: Diagnosis not present

## 2023-05-31 DIAGNOSIS — S0990XA Unspecified injury of head, initial encounter: Secondary | ICD-10-CM | POA: Diagnosis present

## 2023-05-31 MED ORDER — ACETAMINOPHEN 500 MG PO TABS
1000.0000 mg | ORAL_TABLET | Freq: Once | ORAL | Status: AC
  Administered 2023-05-31: 1000 mg via ORAL
  Filled 2023-05-31: qty 2

## 2023-05-31 MED ORDER — LIDOCAINE HCL (PF) 1 % IJ SOLN
5.0000 mL | Freq: Once | INTRAMUSCULAR | Status: AC
  Administered 2023-05-31: 5 mL
  Filled 2023-05-31: qty 5
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
## 2023-05-31 NOTE — ED Notes (Signed)
Patient is being discharged from the Urgent Care and sent to the Emergency Department via POV . Per Urban Gibson, PA-C, patient is in need of higher level of care due to lip lac repair. Patient is aware and verbalizes understanding of plan of care.  Vitals:   05/31/23 1504 05/31/23 1511  BP:  (!) 206/87  Pulse: 68   Resp: 18   Temp: 98 F (36.7 C)   SpO2: 96%

## 2023-05-31 NOTE — ED Notes (Signed)
ED Provider at bedside. 

## 2023-05-31 NOTE — ED Triage Notes (Signed)
Pt reports she has a gash on the lower lip after she tripped and fell 1 hr ago. States the gas was due to her dentures. Denies she hot her head, headache, vision changes, nausea, vomiting, dizziness.

## 2023-05-31 NOTE — Discharge Instructions (Addendum)
Thank you for allowing [place] to be a part of your care today.  You were evaluated in the ED for injuries related to a fall.  Your CT scans were negative for any injury to your skull, brain, neck, or face.  Your lip laceration was repaired using 3 dissolvable sutures.  These should dissolve over time.  I recommend eating soft foods for the next day until your lip swelling improves.  You may eat and drink normally.  Be careful when brushing your teeth so as to not irritate the area where you have a cut on your lip.  Apply ice to the outside of your face 2-3 times per day for the next 24 to 48 hours to help with swelling.  Return to the ED if you develop sudden worsening of your symptoms if you have any new concerns.  Take Tylenol as needed for pain.

## 2023-05-31 NOTE — Discharge Instructions (Signed)
Your intraoral lip laceration requires a higher level of laceration repair than we can perform in the urgent care setting. Please go straight to the emergency room now.

## 2023-05-31 NOTE — ED Provider Notes (Signed)
[place] [facility] AT [facility] Provider Note   CSN: [id number] Arrival date & time: 05/31/23  1543     History  Chief Complaint  Patient presents with   Jessica Peterson is a 89 y.o. female with past medical history significant for hypertension, hypothyroidism presents to the ED from urgent care with lower lip laceration after a fall.  Patient states she tripped and fell and struck her jaw on concrete.  She was wearing dentures at the time, which she states cut the inside of her mouth.  Denies head injury or loss of consciousness.  Urgent care sent patient for sutures.  Patient states since lying in ED bed, she is also now having pain in her left upper leg.  She states this is the leg she tripped with.         Home Medications Prior to Admission medications   Medication Sig Start Date End Date Taking? Authorizing Provider  aspirin EC 81 MG tablet Take 81 mg by mouth daily.    [provider]  cephALEXin (KEFLEX) 500 MG capsule Take 1 capsule (500 mg total) by mouth 4 (four) times daily. 05/10/23   Mesner, Barbara Cower, MD  chlorhexidine (PERIDEX) 0.12 % solution SWISH AND SPIT 15 ML TWICE A DAY 01/12/19   [provider]  cholecalciferol (VITAMIN D3) 25 MCG (1000 UT) tablet Take 1,000 Units by mouth daily.    [provider]  docusate sodium (COLACE) 100 MG capsule Take 1 capsule (100 mg total) by mouth every 12 (twelve) hours. 05/10/23   Mesner, Barbara Cower, MD  hydrochlorothiazide (MICROZIDE) 12.5 MG capsule Take 12.5 mg by mouth daily.  06/04/18   [provider]  ibuprofen (ADVIL) 800 MG tablet Take 800 mg by mouth every 8 (eight) hours as needed. 08/18/19   [provider]  ketorolac (ACULAR) 0.5 % ophthalmic solution Place 1 drop into the right eye 2 (two) times daily.  06/29/18   [provider]  levothyroxine (SYNTHROID) 100 MCG tablet Take 100 mcg by mouth daily. 12/20/20   [provider]  losartan  (COZAAR) 100 MG tablet Take 100 mg by mouth daily.  06/04/18   [provider]  polyethylene glycol (MIRALAX / GLYCOLAX) 17 g packet Take 17 g by mouth daily. 05/10/23   Mesner, Barbara Cower, MD  prednisoLONE acetate (PRED FORTE) 1 % ophthalmic suspension Place 1 drop into the right eye 2 (two) times daily.  06/29/18   [provider]  RESTASIS 0.05 % ophthalmic emulsion 1 drop 2 (two) times daily. 12/05/21   [provider]  sodium phosphate (FLEET) ENEM Place 133 mLs (1 enema total) rectally daily as needed for severe constipation. 05/10/23   Mesner, Barbara Cower, MD  vitamin B-12 (CYANOCOBALAMIN) 1000 MCG tablet Take 1,000 mcg by mouth daily.    [provider]      Allergies    Penicillins    Review of Systems   Review of Systems  Musculoskeletal:  Positive for arthralgias (L upper leg pain). Negative for neck pain.  Skin:  Positive for wound.  Neurological:  Negative for syncope.    Physical Exam Updated Vital Signs BP (!) 187/86 (BP Location: Left Arm)   Pulse 65   Temp 98.3 F (36.8 C) (Oral)   Resp 18   Ht 5\' 2"  (1.575 m)   Wt 59 kg   SpO2 98%   BMI 23.78 kg/m  Physical Exam Vitals and nursing note reviewed.  Constitutional:  General: She is not in acute distress.    Appearance: Normal appearance. She is not ill-appearing or diaphoretic.  HENT:     Head: Normocephalic and atraumatic.     Jaw: There is normal jaw occlusion.     Mouth/Throat:     Mouth: Injury and lacerations (inner lower lip) present.     Dentition: No gingival swelling.     Pharynx: Oropharynx is clear. Uvula midline.     Comments: Patient not currently wearing dentures.  She is missing several teeth on the lower.  She has an approximately 1.5 cm laceration on the inside of the lower lip.  No obvious dental injury.  Lower lip is swollen. Cardiovascular:     Rate and Rhythm: Normal rate and regular rhythm.  Pulmonary:     Effort: Pulmonary effort is normal.  Skin:     General: Skin is warm and dry.     Capillary Refill: Capillary refill takes less than 2 seconds.  Neurological:     Mental Status: She is alert. Mental status is at baseline.  Psychiatric:        Mood and Affect: Mood normal.        Behavior: Behavior normal.     ED Results / Procedures / Treatments   Labs (all labs ordered are listed, but only abnormal results are displayed) Labs Reviewed - No data to display  EKG None  Radiology CT Head Wo Contrast Result Date: 05/31/2023 CLINICAL DATA:  Fall with head, face, and neck trauma/pain. EXAM: CT HEAD WITHOUT CONTRAST CT MAXILLOFACIAL WITHOUT CONTRAST CT CERVICAL SPINE WITHOUT CONTRAST TECHNIQUE: Multidetector CT imaging of the head, cervical spine, and maxillofacial structures were performed using the standard protocol without intravenous contrast. Multiplanar CT image reconstructions of the cervical spine and maxillofacial structures were also generated. RADIATION DOSE REDUCTION: This exam was performed according to the departmental dose-optimization program which includes automated exposure control, adjustment of the mA and/or kV according to patient size and/or use of iterative reconstruction technique. COMPARISON:  CT neck dated 08/11/2018. FINDINGS: CT HEAD FINDINGS Brain: No evidence of acute infarction, hemorrhage, hydrocephalus, extra-axial collection or mass lesion/mass effect. There is mild cerebral volume loss with associated ex vacuo dilatation. Periventricular white matter hypoattenuation likely represents chronic small vessel ischemic disease. Vascular: There are vascular calcifications in the carotid siphons. Skull: Normal. Negative for fracture or focal lesion. Other: None. CT MAXILLOFACIAL FINDINGS Osseous: No fracture or mandibular dislocation. No destructive process. Orbits: Negative. No traumatic or inflammatory finding. Sinuses: There is mild bilateral maxillary and ethmoid sinus disease. Soft tissues: Negative. CT CERVICAL  SPINE FINDINGS Alignment: Normal. Skull base and vertebrae: No acute fracture. No primary bone lesion or focal pathologic process. Soft tissues and spinal canal: No prevertebral fluid or swelling. No visible canal hematoma. Disc levels: Moderate-to-severe multilevel degenerative disc and joint disease. Upper chest: Negative. Other: None. IMPRESSION: 1. No acute intracranial process. 2. No acute facial bone fracture. 3. No acute fracture or traumatic subluxation of the cervical spine. Electronically Signed   By: Romona Curls M.D.   On: 05/31/2023 19:18   CT Cervical Spine Wo Contrast Result Date: 05/31/2023 CLINICAL DATA:  Fall with head, face, and neck trauma/pain. EXAM: CT HEAD WITHOUT CONTRAST CT MAXILLOFACIAL WITHOUT CONTRAST CT CERVICAL SPINE WITHOUT CONTRAST TECHNIQUE: Multidetector CT imaging of the head, cervical spine, and maxillofacial structures were performed using the standard protocol without intravenous contrast. Multiplanar CT image reconstructions of the cervical spine and maxillofacial structures were also generated. RADIATION DOSE REDUCTION: This  exam was performed according to the departmental dose-optimization program which includes automated exposure control, adjustment of the mA and/or kV according to patient size and/or use of iterative reconstruction technique. COMPARISON:  CT neck dated 08/11/2018. FINDINGS: CT HEAD FINDINGS Brain: No evidence of acute infarction, hemorrhage, hydrocephalus, extra-axial collection or mass lesion/mass effect. There is mild cerebral volume loss with associated ex vacuo dilatation. Periventricular white matter hypoattenuation likely represents chronic small vessel ischemic disease. Vascular: There are vascular calcifications in the carotid siphons. Skull: Normal. Negative for fracture or focal lesion. Other: None. CT MAXILLOFACIAL FINDINGS Osseous: No fracture or mandibular dislocation. No destructive process. Orbits: Negative. No traumatic or inflammatory  finding. Sinuses: There is mild bilateral maxillary and ethmoid sinus disease. Soft tissues: Negative. CT CERVICAL SPINE FINDINGS Alignment: Normal. Skull base and vertebrae: No acute fracture. No primary bone lesion or focal pathologic process. Soft tissues and spinal canal: No prevertebral fluid or swelling. No visible canal hematoma. Disc levels: Moderate-to-severe multilevel degenerative disc and joint disease. Upper chest: Negative. Other: None. IMPRESSION: 1. No acute intracranial process. 2. No acute facial bone fracture. 3. No acute fracture or traumatic subluxation of the cervical spine. Electronically Signed   By: Romona Curls M.D.   On: 05/31/2023 19:18   CT Maxillofacial Wo Contrast Result Date: 05/31/2023 CLINICAL DATA:  Fall with head, face, and neck trauma/pain. EXAM: CT HEAD WITHOUT CONTRAST CT MAXILLOFACIAL WITHOUT CONTRAST CT CERVICAL SPINE WITHOUT CONTRAST TECHNIQUE: Multidetector CT imaging of the head, cervical spine, and maxillofacial structures were performed using the standard protocol without intravenous contrast. Multiplanar CT image reconstructions of the cervical spine and maxillofacial structures were also generated. RADIATION DOSE REDUCTION: This exam was performed according to the departmental dose-optimization program which includes automated exposure control, adjustment of the mA and/or kV according to patient size and/or use of iterative reconstruction technique. COMPARISON:  CT neck dated 08/11/2018. FINDINGS: CT HEAD FINDINGS Brain: No evidence of acute infarction, hemorrhage, hydrocephalus, extra-axial collection or mass lesion/mass effect. There is mild cerebral volume loss with associated ex vacuo dilatation. Periventricular white matter hypoattenuation likely represents chronic small vessel ischemic disease. Vascular: There are vascular calcifications in the carotid siphons. Skull: Normal. Negative for fracture or focal lesion. Other: None. CT MAXILLOFACIAL FINDINGS  Osseous: No fracture or mandibular dislocation. No destructive process. Orbits: Negative. No traumatic or inflammatory finding. Sinuses: There is mild bilateral maxillary and ethmoid sinus disease. Soft tissues: Negative. CT CERVICAL SPINE FINDINGS Alignment: Normal. Skull base and vertebrae: No acute fracture. No primary bone lesion or focal pathologic process. Soft tissues and spinal canal: No prevertebral fluid or swelling. No visible canal hematoma. Disc levels: Moderate-to-severe multilevel degenerative disc and joint disease. Upper chest: Negative. Other: None. IMPRESSION: 1. No acute intracranial process. 2. No acute facial bone fracture. 3. No acute fracture or traumatic subluxation of the cervical spine. Electronically Signed   By: Romona Curls M.D.   On: 05/31/2023 19:18   DG FEMUR MIN 2 VIEWS LEFT Result Date: 05/31/2023 CLINICAL DATA:  Trip and fall injury. Left upper leg pain above the knee. EXAM: LEFT FEMUR 2 VIEWS COMPARISON:  None Available. FINDINGS: Mild degenerative changes in the left hip and left knee. Postoperative fixation of the proximal tibia. Left femur appears intact. No evidence of acute fracture or dislocation. No focal bone lesion or bone destruction. Bone cortex appears intact. Vascular calcifications in the soft tissues. IMPRESSION: No acute bony abnormalities.  Mild degenerative changes. Electronically Signed   By: Burman Nieves M.D.   On:  05/31/2023 17:57    Procedures .Laceration Repair  Date/Time: 05/31/2023 8:04 PM  Performed by: Lenard Simmer, PA-C Authorized by: Lenard Simmer, PA-C   Consent:    Consent obtained:  Verbal   Consent given by:  Patient   Risks, benefits, and alternatives were discussed: yes     Risks discussed:  Infection, pain, poor cosmetic result, poor wound healing, need for additional repair and nerve damage   Alternatives discussed:  No treatment and delayed treatment Universal protocol:    Procedure explained and questions  answered to patient or proxy's satisfaction: yes     Patient identity confirmed:  Verbally with patient Anesthesia:    Anesthesia method:  Local infiltration   Local anesthetic:  Lidocaine 1% w/o epi Laceration details:    Location:  Lip   Lip location:  Lower interior lip   Length (cm):  1.5 Pre-procedure details:    Preparation:  Imaging obtained to evaluate for foreign bodies and patient was prepped and draped in usual sterile fashion Exploration:    Limited defect created (wound extended): no     Hemostasis achieved with:  Direct pressure   Imaging obtained: x-ray     Imaging outcome: foreign body not noted     Wound exploration: wound explored through full range of motion and entire depth of wound visualized   Skin repair:    Repair method:  Sutures   Suture size:  5-0   Suture material:  Fast-absorbing gut   Suture technique:  Simple interrupted   Number of sutures:  3 Approximation:    Approximation:  Close   Vermilion border well-aligned: no vermillion border involvement.   Repair type:    Repair type:  Simple Post-procedure details:    Dressing:  Open (no dressing)   Procedure completion:  Tolerated well, no immediate complications     Medications Ordered in ED Medications  lidocaine (PF) (XYLOCAINE) 1 % injection 5 mL (5 mLs Other Given by Other 05/31/23 1940)  acetaminophen (TYLENOL) tablet 1,000 mg (1,000 mg Oral Given 05/31/23 2000)    ED Course/ Medical Decision Making/ A&P                                 Medical Decision Making Amount and/or Complexity of Data Reviewed Radiology: ordered.  Risk OTC drugs. Prescription drug management.   This patient presents to the ED with chief complaint(s) of lip injury with pertinent past medical history of hypertension, hypothyroidism.  The complaint involves an extensive differential diagnosis and also carries with it a high risk of complications and morbidity.    The differential diagnosis includes acute  maxillofacial fracture, acute intracranial injury, skull fracture, fracture or subluxation of the cervical spine, lip laceration, dental injury   The initial plan is to obtain CT imaging  Initial Assessment:   Exam significant for overall well-appearing 89 year old patient who is not in acute distress.  Head is normocephalic and atraumatic.  She is not currently wearing her dentures and has missing dentition on the lower.  She does have an approximately 1.5 cm laceration on the inside of the lower lip on the mucosal.  No active bleeding.  There is mild ecchymosis in this area as well.  Lower lip is also swollen.  Jaw occlusion is normal.  Independent visualization and interpretation of imaging: I independently visualized the following imaging with scope of interpretation limited to determining acute life threatening conditions related to  emergency care: CT head, maxillofacial, cervical spine, which revealed no acute fracture or intracranial abnormality.  X-ray of the left femur also obtained which demonstrates no acute fracture.  Treatment and Reassessment: Patient's lip laceration was repaired.  See procedure section for more detail.  Patient also given 1000 mg of Tylenol for her left leg pain.  Disposition:   Patient was able to ambulate to and from the bathroom without difficulty.  I feel that she is appropriate for discharge home.  Discussed supportive care measures for mouth laceration with patient.  Recommended a soft food diet for the next day or so.  Advised patient to take Tylenol as needed for pain.  The patient has been appropriately medically screened and/or stabilized in the ED. I have low suspicion for any other emergent medical condition which would require further screening, evaluation or treatment in the ED or require inpatient management. At time of discharge the patient is hemodynamically stable and in no acute distress. I have discussed work-up results and diagnosis with patient  and answered all questions. Patient is agreeable with discharge plan. We discussed strict return precautions for returning to the emergency department and they verbalized understanding.            Final Clinical Impression(s) / ED Diagnoses Final diagnoses:  Lip laceration, initial encounter  Fall on same level from slipping, tripping or stumbling, initial encounter  Left leg pain  Hypertension, unspecified type    Rx / DC Orders ED Discharge Orders     None         Lenard Simmer, PA-C 05/31/23 2029    Alvira Monday, MD 06/01/23 1234

## 2023-05-31 NOTE — ED Triage Notes (Signed)
Tripped and fell and hit lower lip on concrete. Denies other injuries. Was wearing dentures which caused laceration inside of mouth. Seen at [facility] and sent here for sutures.

## 2023-05-31 NOTE — ED Notes (Signed)
Pt ambulated independently in the hall to the bathroom with stand by assistance from family.

## 2023-07-21 ENCOUNTER — Encounter (HOSPITAL_COMMUNITY): Payer: Self-pay

## 2023-07-21 ENCOUNTER — Emergency Department (HOSPITAL_COMMUNITY)
Admission: EM | Admit: 2023-07-21 | Discharge: 2023-07-21 | Disposition: A | Discharge status: Home / Self Care | Payer: 59 | Attending: Emergency Medicine | Admitting: Emergency Medicine

## 2023-07-21 ENCOUNTER — Other Ambulatory Visit: Payer: Self-pay

## 2023-07-21 DIAGNOSIS — I10 Essential (primary) hypertension: Secondary | ICD-10-CM | POA: Diagnosis not present

## 2023-07-21 DIAGNOSIS — Z79899 Other long term (current) drug therapy: Secondary | ICD-10-CM | POA: Diagnosis not present

## 2023-07-21 DIAGNOSIS — E039 Hypothyroidism, unspecified: Secondary | ICD-10-CM | POA: Insufficient documentation

## 2023-07-21 DIAGNOSIS — Z7982 Long term (current) use of aspirin: Secondary | ICD-10-CM | POA: Diagnosis not present

## 2023-07-21 DIAGNOSIS — K5900 Constipation, unspecified: Secondary | ICD-10-CM | POA: Diagnosis present

## 2023-07-21 LAB — COMPREHENSIVE METABOLIC PANEL
ALT: 13 U/L (ref 0–44)
AST: 25 U/L (ref 15–41)
Albumin: 3.9 g/dL (ref 3.5–5.0)
Alkaline Phosphatase: 91 U/L (ref 38–126)
Anion gap: 9 (ref 5–15)
BUN: 8 mg/dL (ref 8–23)
CO2: 24 mmol/L (ref 22–32)
Calcium: 9.6 mg/dL (ref 8.9–10.3)
Chloride: 100 mmol/L (ref 98–111)
Creatinine, Ser: 0.91 mg/dL (ref 0.44–1.00)
GFR, Estimated: 58 mL/min — ABNORMAL LOW (ref >60)
Glucose, Bld: 145 mg/dL — ABNORMAL HIGH (ref 70–99)
Potassium: 3.4 mmol/L — ABNORMAL LOW (ref 3.5–5.1)
Sodium: 133 mmol/L — ABNORMAL LOW (ref 135–145)
Total Bilirubin: 0.9 mg/dL (ref 0.0–1.2)
Total Protein: 6.6 g/dL (ref 6.5–8.1)

## 2023-07-21 LAB — CBC WITH DIFFERENTIAL/PLATELET
Abs Immature Granulocytes: 0.03 10*3/uL (ref 0.00–0.07)
Basophils Absolute: 0 10*3/uL (ref 0.0–0.1)
Basophils Relative: 0 %
Eosinophils Absolute: 0.1 10*3/uL (ref 0.0–0.5)
Eosinophils Relative: 1 %
HCT: 37.2 % (ref 36.0–46.0)
Hemoglobin: 12.6 g/dL (ref 12.0–15.0)
Immature Granulocytes: 0 %
Lymphocytes Relative: 13 %
Lymphs Abs: 1.2 10*3/uL (ref 0.7–4.0)
MCH: 28.5 pg (ref 26.0–34.0)
MCHC: 33.9 g/dL (ref 30.0–36.0)
MCV: 84.2 fL (ref 80.0–100.0)
Monocytes Absolute: 0.5 10*3/uL (ref 0.1–1.0)
Monocytes Relative: 5 %
Neutro Abs: 7.6 10*3/uL (ref 1.7–7.7)
Neutrophils Relative %: 81 %
Platelets: 300 10*3/uL (ref 150–400)
RBC: 4.42 MIL/uL (ref 3.87–5.11)
RDW: 13.9 % (ref 11.5–15.5)
WBC: 9.4 10*3/uL (ref 4.0–10.5)
nRBC: 0 % (ref 0.0–0.2)

## 2023-07-21 LAB — LIPASE, BLOOD: Lipase: 20 U/L (ref 11–51)

## 2023-07-21 NOTE — ED Triage Notes (Signed)
Patient reports constipation and rectal pain.  Reports tried to have BM this morning but its stuck and it hurts to sit.  Patient also having some nausesa.

## 2023-07-21 NOTE — ED Provider Notes (Signed)
[place] EM[facility] DEPARTMENT AT [facility] Provider Note   CSN: [id number] Arrival date & time: 07/21/23  1015     History  Chief Complaint  Patient presents with   Constipation    Jessica Peterson is a 89 y.o. female.  Patient with history of hypertension, constipation, hypothyroidism presents today with complaints of constipation.  She states that for the last 2 or 3 days she has had bowel movements that were small and hard. She states that today she felt like she needed to have a bowel movement however when she tried she felt like it was stuck in her rectum.  She subsequently had some green tea and 1 capful of MiraLAX and after a short period of time was unable to have a bowel movement.  She therefore presented with concern for same.  However, while she was in the lobby waiting to be evaluated she did have a very large bowel movements and complete resolution of her symptoms.  She states originally she did have some nausea but this is completely resolved.  She has not had any vomiting.  She has no abdominal pain at this time.  The history is provided by the patient. No language interpreter was used.  Constipation      Home Medications Prior to Admission medications   Medication Sig Start Date End Date Taking? Authorizing Provider  aspirin EC 81 MG tablet Take 81 mg by mouth daily.    [provider]  cephALEXin (KEFLEX) 500 MG capsule Take 1 capsule (500 mg total) by mouth 4 (four) times daily. 05/10/23   Mesner, Barbara Cower, MD  chlorhexidine (PERIDEX) 0.12 % solution SWISH AND SPIT 15 ML TWICE A DAY 01/12/19   [provider]  cholecalciferol (VITAMIN D3) 25 MCG (1000 UT) tablet Take 1,000 Units by mouth daily.    [provider]  docusate sodium (COLACE) 100 MG capsule Take 1 capsule (100 mg total) by mouth every 12 (twelve) hours. 05/10/23   Mesner, Barbara Cower, MD  hydrochlorothiazide (MICROZIDE) 12.5 MG capsule Take 12.5 mg by mouth daily.  06/04/18    [provider]  ibuprofen (ADVIL) 800 MG tablet Take 800 mg by mouth every 8 (eight) hours as needed. 08/18/19   [provider]  ketorolac (ACULAR) 0.5 % ophthalmic solution Place 1 drop into the right eye 2 (two) times daily.  06/29/18   [provider]  levothyroxine (SYNTHROID) 100 MCG tablet Take 100 mcg by mouth daily. 12/20/20   [provider]  losartan (COZAAR) 100 MG tablet Take 100 mg by mouth daily.  06/04/18   [provider]  polyethylene glycol (MIRALAX / GLYCOLAX) 17 g packet Take 17 g by mouth daily. 05/10/23   Mesner, Barbara Cower, MD  prednisoLONE acetate (PRED FORTE) 1 % ophthalmic suspension Place 1 drop into the right eye 2 (two) times daily.  06/29/18   [provider]  RESTASIS 0.05 % ophthalmic emulsion 1 drop 2 (two) times daily. 12/05/21   [provider]  sodium phosphate (FLEET) ENEM Place 133 mLs (1 enema total) rectally daily as needed for severe constipation. 05/10/23   Mesner, Barbara Cower, MD  vitamin B-12 (CYANOCOBALAMIN) 1000 MCG tablet Take 1,000 mcg by mouth daily.    [provider]      Allergies    Penicillins    Review of Systems   Review of Systems  Gastrointestinal:  Positive for constipation (resolved).  All other systems reviewed and are negative.   Physical Exam Updated Vital  Signs BP (!) 178/75   Pulse 76   Temp 98.6 F (37 C) (Oral)   Resp 18   Ht 5\' 2"  (1.575 m)   Wt 59 kg   SpO2 100%   BMI 23.78 kg/m  Physical Exam Vitals and nursing note reviewed.  Constitutional:      General: She is not in acute distress.    Appearance: Normal appearance. She is normal weight. She is not ill-appearing, toxic-appearing or diaphoretic.  HENT:     Head: Normocephalic and atraumatic.  Cardiovascular:     Rate and Rhythm: Normal rate.  Pulmonary:     Effort: Pulmonary effort is normal. No respiratory distress.  Abdominal:     General: Abdomen is flat.     Palpations: Abdomen is soft.      Tenderness: There is no abdominal tenderness.  Musculoskeletal:        General: Normal range of motion.     Cervical back: Normal range of motion.  Skin:    General: Skin is warm and dry.  Neurological:     General: No focal deficit present.     Mental Status: She is alert.  Psychiatric:        Mood and Affect: Mood normal.        Behavior: Behavior normal.     ED Results / Procedures / Treatments   Labs (all labs ordered are listed, but only abnormal results are displayed) Labs Reviewed  COMPREHENSIVE METABOLIC PANEL - Abnormal; Notable for the following components:      Result Value   Sodium 133 (*)    Potassium 3.4 (*)    Glucose, Bld 145 (*)    GFR, Estimated 58 (*)    All other components within normal limits  LIPASE, BLOOD  CBC WITH DIFFERENTIAL/PLATELET  URINALYSIS, ROUTINE W REFLEX MICROSCOPIC    EKG None  Radiology No results found.  Procedures Procedures    Medications Ordered in ED Medications - No data to display  ED Course/ Medical Decision Making/ A&P                                 Medical Decision Making Amount and/or Complexity of Data Reviewed Labs: ordered.   This patient is a 89 y.o. female who presents to the ED for concern of constipation, this involves an extensive number of treatment options, and is a complaint that carries with it a high risk of complications and morbidity. The emergent differential diagnosis prior to evaluation includes, but is not limited to,  impaction, obstruction, neoplasm, constipation  . This is not an exhaustive differential.   Past Medical History / Co-morbidities / Social History:  has a past medical history of Dry eyes, Hypertension, and Hypothyroidism.  Additional history: Chart reviewed. Pertinent results include: Seen back in November 2024 for constipation and required fecal disimpaction at that time.  Physical Exam: Physical exam performed. The pertinent findings include: Overall well-appearing,  abdomen soft and nontender.  Lab Tests: I ordered, and personally interpreted labs.  The pertinent results include:  no leukocytosis, Na 133, K 3.4, likely due to decreased oral intake few days due to constipation  Disposition: After consideration of the diagnostic results and the patients response to treatment, I feel that emergency department workup does not suggest an emergent condition requiring admission or immediate intervention beyond what has been performed at this time. The plan is: Discharge with close outpatient follow-up and return precautions.  Upon my evaluation, patient notes that she had a large bowel movement while waiting in the lobby to be seen with subsequent complete resolution of her symptoms.  Specifically, she no longer has nausea, abdominal pain, or rectal pain.  She never had any fevers or chills.  No indication to proceed with imaging at this time.  Patient feels ready to go home.  Her blood pressure is minimally elevated, she did take her medication this morning.  Recommend close monitoring and discussion with her primary doctor outpatient. Evaluation and diagnostic testing in the emergency department does not suggest an emergent condition requiring admission or immediate intervention beyond what has been performed at this time.  Plan for discharge with close PCP follow-up.  Patient is understanding and amenable with plan, educated on red flag symptoms that would prompt immediate return.  Patient discharged in stable condition.  Final Clinical Impression(s) / ED Diagnoses Final diagnoses:  Constipation, unspecified constipation type    Rx / DC Orders ED Discharge Orders     None     An After Visit Summary was printed and given to the patient.     Vear Clock 07/21/23 1407    Gerhard Munch, MD 07/22/23 1650

## 2023-07-21 NOTE — Discharge Instructions (Addendum)
As we discussed, your workup in the ER today was reassuring for acute findings.  Laboratory evaluation did not reveal any emergent concerns today.  Given that you had a large bowel movement while waiting to be seen followed by complete resolution of your symptoms, no further intervention is indicated at this time.  I recommend that you discuss long-term management of the symptoms further with your primary doctor at your next appointment.  Please call them to schedule an appointment at your earliest convenience.  You may proceed forward with daily MiraLAX as needed if difficulties with bowel movements continue.  Return if development of any new or worsening symptoms.

## 2023-07-21 NOTE — ED Notes (Signed)
Pt had loose bowel movement in lobby

## 2023-09-23 ENCOUNTER — Ambulatory Visit (INDEPENDENT_AMBULATORY_CARE_PROVIDER_SITE_OTHER): Admitting: Podiatry

## 2023-09-23 ENCOUNTER — Encounter: Payer: Self-pay | Admitting: Podiatry

## 2023-09-23 DIAGNOSIS — I739 Peripheral vascular disease, unspecified: Secondary | ICD-10-CM

## 2023-09-23 DIAGNOSIS — B351 Tinea unguium: Secondary | ICD-10-CM | POA: Diagnosis not present

## 2023-09-23 DIAGNOSIS — M79675 Pain in left toe(s): Secondary | ICD-10-CM

## 2023-09-23 DIAGNOSIS — L84 Corns and callosities: Secondary | ICD-10-CM

## 2023-09-23 DIAGNOSIS — M79674 Pain in right toe(s): Secondary | ICD-10-CM | POA: Diagnosis not present

## 2023-09-29 NOTE — Progress Notes (Signed)
 Subjective:  Patient ID: Jessica Peterson, female    DOB: March 30, 1926,  MRN: [id number]  89 y.o. female presents to clinic with  at risk foot care. Patient has h/o PAD and corn(s) b/l feet, callus(es) b/l feet and painful elongated, discolored, dystrophic nails.  Pain interferes with ambulation. Aggravating factors include wearing enclosed shoe gear. Painful toenails interfere with ambulation. Aggravating factors include wearing enclosed shoe gear. Pain is relieved with periodic professional debridement. Painful corns and calluses are aggravated when weightbearing with and without shoegear. Pain is relieved with periodic professional debridement. Patient states she lost her husband in September. Chief Complaint  Patient presents with   Nail Problem    "I want to get my nails cut."   Callouses    "I have calluses on the side of both my big toes."     New problem(s): None   PCP is Margarete Sharps, MD.  Allergies  Allergen Reactions   Penicillins Other (See Comments)    REACTION: rash  Did it involve swelling of the face/tongue/throat, SOB, or low BP? Unknown  Did it involve sudden or severe rash/hives, skin peeling, or any reaction on the inside of your mouth or nose? Yes  Did you need to seek medical attention at a hospital or doctor's office? No  When did it last happen?      10 + YEARS  If all above answers are "NO", may proceed with cephalosporin use.  REACTION: rash, Did it involve swelling of the face/tongue/throat, SOB, or low BP? Unknown, Did it involve sudden or severe rash/hives, skin peeling, or any reaction on the inside of your mouth or nose? Yes, Did you need to seek medical attention at a hospital or doctor's office? No, When did it last happen?      10 + YEARS, If all above answers are "NO", may proceed with cephalosporin use.    Review of Systems: Negative except as noted in the HPI.   Objective:  Jessica Peterson is a pleasant 89 y.o. female WD, WN in NAD. AAO x  3.  Vascular Examination: CFT <3 seconds b/l LE. Faintly palpable DP pulses b/l. Nonpalpable PT pulses b/l. Pedal hair sparse b/l. Skin temperature gradient WNL b/l. No pain with calf compression b/l. No edema b/l LE. No cyanosis or clubbing noted b/l LE.  Neurological Examination: Sensation grossly intact b/l with 10 gram monofilament. Vibratory sensation intact b/l.   Dermatological Examination: Pedal skin with normal turgor, texture and tone b/l. Toenails 1-5 b/l thick, discolored, elongated with subungual debris and pain on dorsal palpation.Hyperkeratotic lesion(s) left great toe, L 5th toe, right great toe, R 4th toe, and R 5th toe.  No erythema, no edema, no drainage, no fluctuance.  Musculoskeletal Examination: Muscle strength 5/5 to b/l LE. Hammertoe(s) bilateral 5th toes.  Radiographs: None  Last A1c:       No data to display           Assessment:   1. Pain due to onychomycosis of toenails of both feet   2. Corns and callosities   3. PAD (peripheral artery disease) (HCC)    Plan:  Extended condolences to [name] on the loss of her husband. Consent given for treatment. Patient examined. All patient's and/or POA's questions/concerns addressed on today's visit. Mycotic toenails 1-5 debrided in length and girth without incident. Corn(s)/callus(es) bilateral great toes, bilateral 5th toes, and R 4th toe pared with sharp debridement without incident.Continue soft, supportive shoe gear daily. Report any pedal injuries to  medical professional. Call office if there are any quesitons/concerns. -Patient/POA to call should there be question/concern in the interim.  Return in about 3 months (around 12/23/2023).  Jessica Peterson, DPM      [place] LOCATION: [address] [place] [address]                   Office [phone]   [facility] LOCATION: [address] [place], [place] [address] Office [phone]

## 2024-01-05 ENCOUNTER — Encounter: Payer: Self-pay | Admitting: Podiatry

## 2024-01-05 ENCOUNTER — Ambulatory Visit (INDEPENDENT_AMBULATORY_CARE_PROVIDER_SITE_OTHER): Admitting: Podiatry

## 2024-01-05 DIAGNOSIS — M79674 Pain in right toe(s): Secondary | ICD-10-CM

## 2024-01-05 DIAGNOSIS — M79675 Pain in left toe(s): Secondary | ICD-10-CM

## 2024-01-05 DIAGNOSIS — I739 Peripheral vascular disease, unspecified: Secondary | ICD-10-CM

## 2024-01-05 DIAGNOSIS — L84 Corns and callosities: Secondary | ICD-10-CM

## 2024-01-05 DIAGNOSIS — B351 Tinea unguium: Secondary | ICD-10-CM | POA: Diagnosis not present

## 2024-01-05 NOTE — Progress Notes (Unsigned)
  Subjective:  Patient ID: Jessica Peterson, female    DOB: [birth year],  MRN: [id number]  Jessica Peterson presents to clinic today for {jgcomplaint:23593}  Chief Complaint  Patient presents with   [facility]    Rm15 Dr. Lillia Diabetic   New problem(s): None. {jgcomplaint:23593}  PCP is Onita Rush, MD.  Allergies  Allergen Reactions   Penicillins Other (See Comments)    REACTION: rash  Did it involve swelling of the face/tongue/throat, SOB, or low BP? Unknown  Did it involve sudden or severe rash/hives, skin peeling, or any reaction on the inside of your mouth or nose? Yes  Did you need to seek medical attention at a hospital or doctor's office? No  When did it last happen?      10 + YEARS  If all above answers are "NO", may proceed with cephalosporin use.  REACTION: rash, Did it involve swelling of the face/tongue/throat, SOB, or low BP? Unknown, Did it involve sudden or severe rash/hives, skin peeling, or any reaction on the inside of your mouth or nose? Yes, Did you need to seek medical attention at a hospital or doctor's office? No, When did it last happen?      10 + YEARS, If all above answers are "NO", may proceed with cephalosporin use.    Review of Systems: Negative except as noted in the HPI.  Objective: No changes noted in today's physical examination. There were no vitals filed for this visit. Jessica Peterson is a pleasant 89 y.o. female {jgbodyhabitus:24098} AAO x 3.  Vascular Examination: CFT <3 seconds b/l. DP pulses faintly palpable b/l. PT pulses nonpalpable b/l. Digital hair absent. Skin temperature gradient warm to warm b/l. No pain with calf compression. No ischemia or gangrene. No cyanosis or clubbing noted b/l. {jgvascular:23595}   Neurological Examination: Sensation grossly intact b/l with 10 gram monofilament. Vibratory sensation intact b/l. {jgneuro:23601}  Dermatological Examination: Pedal skin warm and supple b/l. No open wounds b/l. No interdigital  macerations. Toenails 1-5 b/l thick, discolored, elongated with subungual debris and pain on dorsal palpation. No hyperkeratotic nor porokeratotic lesions.  No corns, calluses, nor porokeratotic lesions. {jgderm:23598}  Musculoskeletal Examination: {jgmsk:23600}  Radiographs: None  Xrays {jgPodToeLocator:23637}: {jgxrayfindings:23683}  Last HgA1c:       No data to display          Assessment/Plan: 1. Pain due to onychomycosis of toenails of both feet   2. Corns and callosities   3. PAD (peripheral artery disease) (HCC)     {Jgplan:23602::-Patient/POA to call should there be question/concern in the interim.}   Return in about 3 months (around 04/06/2024).  Jessica Peterson, DPM      [place] LOCATION: [address] [place] [address]                   Office [phone]   [facility] LOCATION: [address] [place], [place] [address] Office [phone]

## 2024-01-05 NOTE — Patient Instructions (Signed)
 Try Orthofeet shoes which can be purchased on [employer]. Consider high toe box and wide width.

## 2024-04-19 ENCOUNTER — Ambulatory Visit: Admitting: Podiatry

## 2024-04-19 ENCOUNTER — Encounter: Payer: Self-pay | Admitting: Podiatry

## 2024-04-19 DIAGNOSIS — M79675 Pain in left toe(s): Secondary | ICD-10-CM | POA: Diagnosis not present

## 2024-04-19 DIAGNOSIS — B351 Tinea unguium: Secondary | ICD-10-CM | POA: Diagnosis not present

## 2024-04-19 DIAGNOSIS — M79674 Pain in right toe(s): Secondary | ICD-10-CM | POA: Diagnosis not present

## 2024-04-19 DIAGNOSIS — I739 Peripheral vascular disease, unspecified: Secondary | ICD-10-CM

## 2024-04-19 DIAGNOSIS — L84 Corns and callosities: Secondary | ICD-10-CM | POA: Diagnosis not present

## 2024-04-28 NOTE — Progress Notes (Signed)
 Subjective:  Patient ID: Jessica Peterson, female    DOB: January 06, 1926,  MRN: [id number]  Jessica Peterson presents to clinic today for at risk foot care. Patient has h/o PAD and corn(s) b/l feet, callus(es) b/l feet and painful elongated, discolored, dystrophic nails.  Pain interferes with ambulation. Aggravating factors include wearing enclosed shoe gear. Painful toenails interfere with ambulation. Aggravating factors include wearing enclosed shoe gear. Pain is relieved with periodic professional debridement. Painful corns and calluses are aggravated when weightbearing with and without shoegear. Pain is relieved with periodic professional debridement.  Chief Complaint  Patient presents with   Nail Problem    Thick painful toenails, 3 month follow up    New problem(s): None.   PCP is Onita Rush, MD.  Allergies  Allergen Reactions   Penicillins Other (See Comments)    REACTION: rash  Did it involve swelling of the face/tongue/throat, SOB, or low BP? Unknown  Did it involve sudden or severe rash/hives, skin peeling, or any reaction on the inside of your mouth or nose? Yes  Did you need to seek medical attention at a hospital or doctor's office? No  When did it last happen?      10 + YEARS  If all above answers are "NO", may proceed with cephalosporin use.  REACTION: rash, Did it involve swelling of the face/tongue/throat, SOB, or low BP? Unknown, Did it involve sudden or severe rash/hives, skin peeling, or any reaction on the inside of your mouth or nose? Yes, Did you need to seek medical attention at a hospital or doctor's office? No, When did it last happen?      10 + YEARS, If all above answers are "NO", may proceed with cephalosporin use.    Review of Systems: Negative except as noted in the HPI.  Objective: No changes noted in today's physical examination. There were no vitals filed for this visit. Jessica Peterson is a pleasant 89 y.o. female WD, WN in NAD. AAO x 3.  Vascular  Examination: CFT <3 seconds b/l. DP pulses faintly palpable b/l. PT pulses nonpalpable b/l. Digital hair absent. Skin temperature gradient warm to warm b/l. No pain with calf compression. No ischemia or gangrene. No cyanosis or clubbing noted b/l. No edema noted b/l LE.   Neurological Examination: Sensation grossly intact b/l with 10 gram monofilament. Vibratory sensation intact b/l.   Dermatological Examination: Pedal skin warm and supple b/l. No open wounds b/l. No interdigital macerations. Toenails 1-5 b/l thick, discolored, elongated with subungual debris and pain on dorsal palpation. No hyperkeratotic nor porokeratotic lesions.  Hyperkeratotic lesion(s) medial IPJ of left great toe, medial IPJ of right great toe, medial DIPJ of R 5th toe, lateral PIPJ of R 4th toe, and dorsal PIPJ of R 5th toe.  No erythema, no edema, no drainage, no fluctuance.  Musculoskeletal Examination: Muscle strength 5/5 to all lower extremity muscle groups bilaterally. HAV with bunion bilaterally and hammertoes 2-5 b/l.SABRA No pain, crepitus or joint limitation noted with ROM b/l LE.  Patient ambulates independently without assistive aids.  Radiographs: None  Assessment/Plan: 1. Pain due to onychomycosis of toenails of both feet   2. Corns and callosities   3. PAD (peripheral artery disease)   Consent given for treatment. Patient examined. All patient's and/or POA's questions/concerns addressed on today's visit. Mycotic toenails 1-5 b/l debrided in length and girth without incident. Corn(s)/callus(es) medial IPJ of left great toe, medial IPJ of right great toe, medial DIPJ of right fifth digit, lateral PIPJ  of right fourth digit, and dorsal PIPJ of right fifth digit pared with sharp debridement without incident.Continue soft, supportive shoe gear daily. Report any pedal injuries to medical professional. Call office if there are any quesitons/concerns. Return in about 3 months (around 07/20/2024).  Delon LITTIE Merlin,  DPM      [place] LOCATION: [address] [place] [address]                   Office [phone]   [facility] LOCATION: [address] [place], [place] [address] Office [phone]

## 2024-08-02 ENCOUNTER — Ambulatory Visit: Admitting: Podiatry

## 2024-10-18 ENCOUNTER — Ambulatory Visit: Admitting: Podiatry
